# Patient Record
Sex: Female | Born: 1970
Health system: Southern US, Community
[De-identification: ages and names within clinical notes are randomized; demographics above are authoritative.]

## PROBLEM LIST (undated history)

## (undated) DIAGNOSIS — F32A Depression, unspecified: Secondary | ICD-10-CM

## (undated) DIAGNOSIS — F329 Major depressive disorder, single episode, unspecified: Secondary | ICD-10-CM

## (undated) DIAGNOSIS — K219 Gastro-esophageal reflux disease without esophagitis: Secondary | ICD-10-CM

## (undated) DIAGNOSIS — I10 Essential (primary) hypertension: Secondary | ICD-10-CM

## (undated) DIAGNOSIS — E669 Obesity, unspecified: Secondary | ICD-10-CM

## (undated) DIAGNOSIS — E7800 Pure hypercholesterolemia, unspecified: Secondary | ICD-10-CM

## (undated) HISTORY — PX: FASCIOTOMY FOOT / TOE: SUR603

## (undated) HISTORY — PX: NOVASURE ABLATION: SHX5394

## (undated) HISTORY — DX: Gastro-esophageal reflux disease without esophagitis: K21.9

## (undated) HISTORY — DX: Obesity, unspecified: E66.9

## (undated) HISTORY — PX: BREAST REDUCTION SURGERY: SHX8

---

## 1997-07-26 ENCOUNTER — Encounter: Admission: RE | Admit: 1997-07-26 | Discharge: 1997-10-24 | Payer: Self-pay | Admitting: Family Medicine

## 1997-11-24 ENCOUNTER — Encounter: Admission: RE | Admit: 1997-11-24 | Discharge: 1998-02-22 | Payer: Self-pay | Admitting: Family Medicine

## 2001-12-25 ENCOUNTER — Encounter: Admission: RE | Admit: 2001-12-25 | Discharge: 2002-03-25 | Payer: Self-pay | Admitting: *Deleted

## 2001-12-26 ENCOUNTER — Encounter: Admission: RE | Admit: 2001-12-26 | Discharge: 2001-12-26 | Payer: Self-pay | Admitting: *Deleted

## 2002-09-24 ENCOUNTER — Other Ambulatory Visit: Admission: RE | Admit: 2002-09-24 | Discharge: 2002-09-24 | Payer: Self-pay | Admitting: Obstetrics and Gynecology

## 2004-07-04 ENCOUNTER — Other Ambulatory Visit: Admission: RE | Admit: 2004-07-04 | Discharge: 2004-07-04 | Payer: Self-pay | Admitting: Family Medicine

## 2005-04-19 ENCOUNTER — Encounter: Admission: RE | Admit: 2005-04-19 | Discharge: 2005-04-19 | Payer: Self-pay | Admitting: Family Medicine

## 2009-05-22 ENCOUNTER — Emergency Department (HOSPITAL_COMMUNITY): Admission: EM | Admit: 2009-05-22 | Discharge: 2009-05-22 | Payer: Self-pay | Admitting: Internal Medicine

## 2010-06-05 ENCOUNTER — Emergency Department (HOSPITAL_COMMUNITY)
Admission: EM | Admit: 2010-06-05 | Discharge: 2010-06-05 | Disposition: A | Discharge status: Home / Self Care | Payer: Self-pay | Attending: Emergency Medicine | Admitting: Emergency Medicine

## 2010-06-05 DIAGNOSIS — R42 Dizziness and giddiness: Secondary | ICD-10-CM | POA: Insufficient documentation

## 2010-06-05 DIAGNOSIS — N898 Other specified noninflammatory disorders of vagina: Secondary | ICD-10-CM | POA: Insufficient documentation

## 2010-06-05 DIAGNOSIS — Z79899 Other long term (current) drug therapy: Secondary | ICD-10-CM | POA: Insufficient documentation

## 2010-06-05 DIAGNOSIS — I10 Essential (primary) hypertension: Secondary | ICD-10-CM | POA: Insufficient documentation

## 2010-06-05 DIAGNOSIS — N920 Excessive and frequent menstruation with regular cycle: Secondary | ICD-10-CM | POA: Insufficient documentation

## 2010-06-05 DIAGNOSIS — D649 Anemia, unspecified: Secondary | ICD-10-CM | POA: Insufficient documentation

## 2010-06-05 DIAGNOSIS — E785 Hyperlipidemia, unspecified: Secondary | ICD-10-CM | POA: Insufficient documentation

## 2010-06-05 DIAGNOSIS — R112 Nausea with vomiting, unspecified: Secondary | ICD-10-CM | POA: Insufficient documentation

## 2010-06-05 LAB — DIFFERENTIAL
Basophils Absolute: 0 10*3/uL (ref 0.0–0.1)
Basophils Relative: 1 % (ref 0–1)
Eosinophils Absolute: 0.1 10*3/uL (ref 0.0–0.7)
Eosinophils Relative: 3 % (ref 0–5)
Lymphocytes Relative: 42 % (ref 12–46)
Lymphs Abs: 1.9 10*3/uL (ref 0.7–4.0)
Monocytes Absolute: 0.3 10*3/uL (ref 0.1–1.0)
Monocytes Relative: 6 % (ref 3–12)
Neutro Abs: 2.2 10*3/uL (ref 1.7–7.7)
Neutrophils Relative %: 48 % (ref 43–77)

## 2010-06-05 LAB — POCT I-STAT, CHEM 8
BUN: 13 mg/dL (ref 6–23)
Calcium, Ion: 1.19 mmol/L (ref 1.12–1.32)
Chloride: 104 mEq/L (ref 96–112)
Creatinine, Ser: 1.1 mg/dL (ref 0.4–1.2)
Glucose, Bld: 108 mg/dL — ABNORMAL HIGH (ref 70–99)
HCT: 33 % — ABNORMAL LOW (ref 36.0–46.0)
Hemoglobin: 11.2 g/dL — ABNORMAL LOW (ref 12.0–15.0)
Potassium: 3.6 mEq/L (ref 3.5–5.1)
Sodium: 139 mEq/L (ref 135–145)
TCO2: 25 mmol/L (ref 0–100)

## 2010-06-05 LAB — CBC
MCH: 25.7 pg — ABNORMAL LOW (ref 26.0–34.0)
MCV: 79.8 fL (ref 78.0–100.0)
Platelets: 344 10*3/uL (ref 150–400)
RBC: 4.01 MIL/uL (ref 3.87–5.11)

## 2010-06-05 LAB — URINALYSIS, ROUTINE W REFLEX MICROSCOPIC
Bilirubin Urine: NEGATIVE
Hgb urine dipstick: NEGATIVE
Specific Gravity, Urine: 1.022 (ref 1.005–1.030)
Urobilinogen, UA: 1 mg/dL (ref 0.0–1.0)

## 2010-07-03 ENCOUNTER — Other Ambulatory Visit (HOSPITAL_COMMUNITY): Payer: Self-pay | Admitting: Obstetrics

## 2010-07-03 DIAGNOSIS — Z1231 Encounter for screening mammogram for malignant neoplasm of breast: Secondary | ICD-10-CM

## 2010-07-05 ENCOUNTER — Other Ambulatory Visit (HOSPITAL_COMMUNITY): Payer: Self-pay | Admitting: Obstetrics

## 2010-07-05 ENCOUNTER — Ambulatory Visit (HOSPITAL_COMMUNITY)
Admission: RE | Admit: 2010-07-05 | Discharge: 2010-07-05 | Disposition: A | Discharge status: Home / Self Care | Payer: PRIVATE HEALTH INSURANCE | Source: Ambulatory Visit | Attending: Obstetrics | Admitting: Obstetrics

## 2010-07-05 DIAGNOSIS — Z1231 Encounter for screening mammogram for malignant neoplasm of breast: Secondary | ICD-10-CM | POA: Insufficient documentation

## 2010-07-05 DIAGNOSIS — R1900 Intra-abdominal and pelvic swelling, mass and lump, unspecified site: Secondary | ICD-10-CM

## 2010-07-07 ENCOUNTER — Ambulatory Visit (HOSPITAL_COMMUNITY)
Admission: RE | Admit: 2010-07-07 | Discharge: 2010-07-07 | Disposition: A | Discharge status: Home / Self Care | Payer: PRIVATE HEALTH INSURANCE | Source: Ambulatory Visit | Attending: Obstetrics | Admitting: Obstetrics

## 2010-07-07 ENCOUNTER — Ambulatory Visit (HOSPITAL_COMMUNITY): Payer: PRIVATE HEALTH INSURANCE

## 2010-07-07 DIAGNOSIS — N949 Unspecified condition associated with female genital organs and menstrual cycle: Secondary | ICD-10-CM | POA: Insufficient documentation

## 2010-07-07 DIAGNOSIS — N938 Other specified abnormal uterine and vaginal bleeding: Secondary | ICD-10-CM | POA: Insufficient documentation

## 2010-07-07 DIAGNOSIS — D251 Intramural leiomyoma of uterus: Secondary | ICD-10-CM | POA: Insufficient documentation

## 2010-07-07 DIAGNOSIS — R1900 Intra-abdominal and pelvic swelling, mass and lump, unspecified site: Secondary | ICD-10-CM

## 2010-07-17 ENCOUNTER — Encounter: Payer: Self-pay | Admitting: Obstetrics & Gynecology

## 2010-07-26 ENCOUNTER — Other Ambulatory Visit: Payer: Self-pay | Admitting: Obstetrics

## 2010-07-26 ENCOUNTER — Encounter (HOSPITAL_COMMUNITY): Payer: PRIVATE HEALTH INSURANCE

## 2010-07-26 LAB — CBC
HCT: 38.8 % (ref 36.0–46.0)
MCV: 82 fL (ref 78.0–100.0)
RBC: 4.73 MIL/uL (ref 3.87–5.11)
RDW: 16.4 % — ABNORMAL HIGH (ref 11.5–15.5)
WBC: 4.7 10*3/uL (ref 4.0–10.5)

## 2010-07-26 LAB — BASIC METABOLIC PANEL
BUN: 16 mg/dL (ref 6–23)
CO2: 26 mEq/L (ref 19–32)
Chloride: 99 mEq/L (ref 96–112)
Glucose, Bld: 111 mg/dL — ABNORMAL HIGH (ref 70–99)
Potassium: 3.5 mEq/L (ref 3.5–5.1)

## 2010-08-02 ENCOUNTER — Other Ambulatory Visit: Payer: Self-pay | Admitting: Obstetrics

## 2010-08-02 ENCOUNTER — Ambulatory Visit (HOSPITAL_COMMUNITY)
Admission: RE | Admit: 2010-08-02 | Discharge: 2010-08-02 | Disposition: A | Discharge status: Home / Self Care | Payer: PRIVATE HEALTH INSURANCE | Source: Ambulatory Visit | Attending: Obstetrics | Admitting: Obstetrics

## 2010-08-02 DIAGNOSIS — N949 Unspecified condition associated with female genital organs and menstrual cycle: Secondary | ICD-10-CM | POA: Insufficient documentation

## 2010-08-02 DIAGNOSIS — Z01818 Encounter for other preprocedural examination: Secondary | ICD-10-CM | POA: Insufficient documentation

## 2010-08-02 DIAGNOSIS — N938 Other specified abnormal uterine and vaginal bleeding: Secondary | ICD-10-CM | POA: Insufficient documentation

## 2010-08-02 DIAGNOSIS — D259 Leiomyoma of uterus, unspecified: Secondary | ICD-10-CM | POA: Insufficient documentation

## 2010-08-02 DIAGNOSIS — Z01812 Encounter for preprocedural laboratory examination: Secondary | ICD-10-CM | POA: Insufficient documentation

## 2010-08-09 NOTE — Op Note (Signed)
  NAME:  Patricia Mcclure, Patricia Mcclure NO.:  [id number]  MEDICAL RECORD NO.:  [id number]           PATIENT TYPE:  O  LOCATION:  [facility]                          [facility]  [facility]  PHYSICIAN:  Kathreen Cosier, M.D.DATE OF BIRTH:  [birth year]  DATE OF PROCEDURE:  08/02/2010 DATE OF DISCHARGE:                              OPERATIVE REPORT   PREOPERATIVE DIAGNOSIS:  Dysfunctional uterine bleeding.  POSTOPERATIVE DIAGNOSIS:  Dysfunctional uterine bleeding and myoma uteri.  SURGEON:  Kathreen Cosier, MD  DESCRIPTION OF PROCEDURE:  Under general anesthesia, perineum and vagina prepped and draped.  Bladder emptied with straight catheter.  Bimanual exam revealed the uterus to be enlarged, 12-14 weeks' size, irregular. Speculum was placed in the vagina.  Cervix injected with 10 mL of 1% Xylocaine.  Anterior lip of the cervix grasped with tenaculum. Endometrial cavity sounded to 11 cm.  The cervix measured at 6 cm giving a cavity length of 5 cm.  Cervix dilated to #25 Shawnie Pons.  The hysteroscope was inserted.  The cavity was cleaned.  The hysteroscope was removed and sharp curettage performed.  Small amount of tissue obtained.  The NovaSure device was then inserted in the cavity, integrity test passed, and then ablation was done for 1 minute at 110 watts.  The cavity width was 4 cm.  Post ablation, a repeat hysteroscopy revealed total ablation of the cavity.  The fluid deficit was 85 mL.  The patient tolerated the procedure well and taken to the recovery room in good condition.          ______________________________ Kathreen Cosier, M.D.     BAM/MEDQ  D:  08/02/2010  T:  08/02/2010  Job:  [id number]  Electronically Signed by Francoise Ceo M.D. on 08/09/2010 10:13:21 AM

## 2011-07-27 ENCOUNTER — Emergency Department (INDEPENDENT_AMBULATORY_CARE_PROVIDER_SITE_OTHER)
Admission: EM | Admit: 2011-07-27 | Discharge: 2011-07-27 | Disposition: A | Discharge status: Home / Self Care | Payer: Self-pay | Source: Home / Self Care | Attending: Family Medicine | Admitting: Family Medicine

## 2011-07-27 ENCOUNTER — Encounter (HOSPITAL_COMMUNITY): Payer: Self-pay

## 2011-07-27 DIAGNOSIS — R229 Localized swelling, mass and lump, unspecified: Secondary | ICD-10-CM

## 2011-07-27 HISTORY — DX: Pure hypercholesterolemia, unspecified: E78.00

## 2011-07-27 HISTORY — DX: Essential (primary) hypertension: I10

## 2011-07-27 NOTE — ED Provider Notes (Signed)
History     CSN: [id number]  Arrival date & time 07/27/11  9147   First MD Initiated Contact with Patient 07/27/11 [phone]      Chief Complaint  Patient presents with  . Breast Mass    (Consider location/radiation/quality/duration/timing/severity/associated sxs/prior treatment) Patient is a 41 y.o. female presenting with rash. The history is provided by the patient.  Rash  This is a new problem. The current episode started yesterday (pt noticed lump in skin of left breast yest.). The problem has not changed since onset.The problem is associated with nothing (had mammogram last yr.). There has been no fever.    Past Medical History  Diagnosis Date  . Hypertension   . High cholesterol     History reviewed. No pertinent past surgical history.  History reviewed. No pertinent family history.  History  Substance Use Topics  . Smoking status: Never Smoker   . Smokeless tobacco: Not on file  . Alcohol Use: No    OB History    Grav Para Term Preterm Abortions TAB SAB Ect Mult Living                  Review of Systems  Constitutional: Negative.   Skin: Positive for rash.    Allergies  Review of patient's allergies indicates no known allergies.  Home Medications   Current Outpatient Rx  Name Route Sig Dispense Refill  . LISINOPRIL 10 MG PO TABS Oral Take 10 mg by mouth daily.    Marland Kitchen LOVASTATIN 10 MG PO TABS Oral Take 10 mg by mouth at bedtime.      BP 151/74  Pulse 64  Temp(Src) 98.4 F (36.9 C) (Oral)  Resp 16  SpO2 99%  Physical Exam  Nursing note and vitals reviewed. Constitutional: She is oriented to person, place, and time. She appears well-developed and well-nourished.  Neurological: She is alert and oriented to person, place, and time.  Skin: Skin is warm and dry. Rash noted.       2mm intradermal mobile nodule to left lat Breast, 3o clock location, nontender no drainage, no erythema, remainder of bilat breast exam --benign fibrocystic changes, no  adenopathy, no nipple drainage.    ED Course  Procedures (including critical care time)  Labs Reviewed - No data to display No results found.   1. Single skin nodule       MDM          Linna Hoff, MD 07/27/11 1044

## 2011-07-27 NOTE — Discharge Instructions (Signed)
Use warm soak and neosporin ointment as needed, see your doctor or return if other changes develop.

## 2011-07-27 NOTE — ED Notes (Signed)
States she noted a lump in her LEFT breast while in shower yesterday; concerned it may be a bite of some kind; denies pain

## 2011-08-21 ENCOUNTER — Other Ambulatory Visit (HOSPITAL_COMMUNITY): Payer: Self-pay | Admitting: Family Medicine

## 2011-08-21 DIAGNOSIS — Z1231 Encounter for screening mammogram for malignant neoplasm of breast: Secondary | ICD-10-CM

## 2011-08-30 IMAGING — US US TRANSVAGINAL NON-OB
1 series · 13 of 25 positions shown · non-contrast
Comparison: None

CLINICAL DATA: Dysfunctional uterine bleeding.  Pelvic mass.  LMP
05/03/2010.  Day 4 of Provera



[Series 1: us pelvis complete · 13 of 56 slices shown]
[im 1/56]
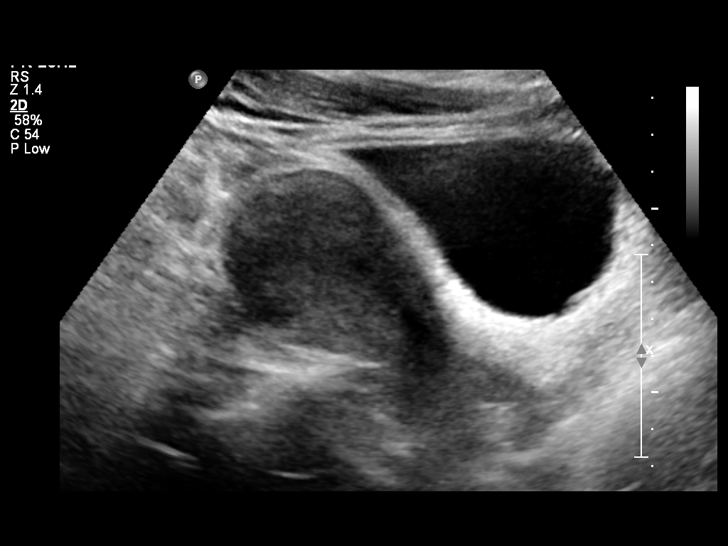
[im 5/56]
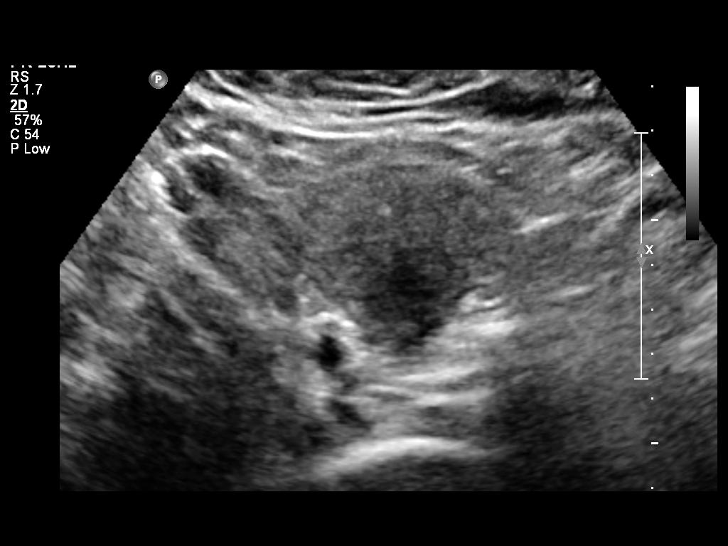
[im 10/56]
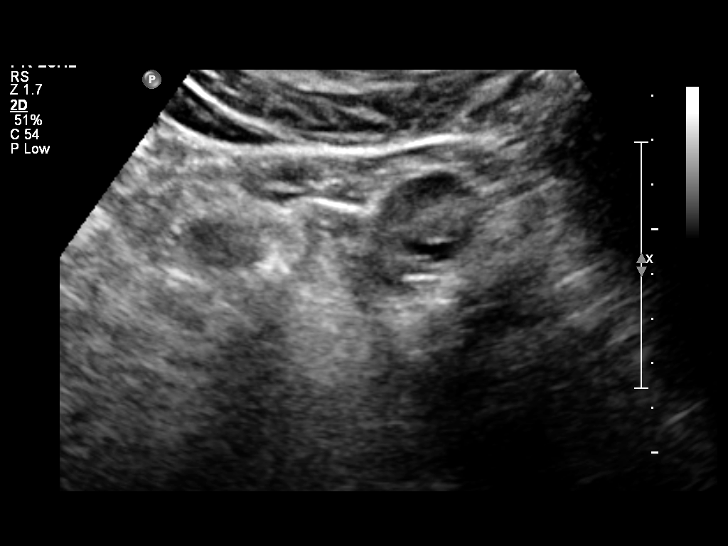
[im 14/56]
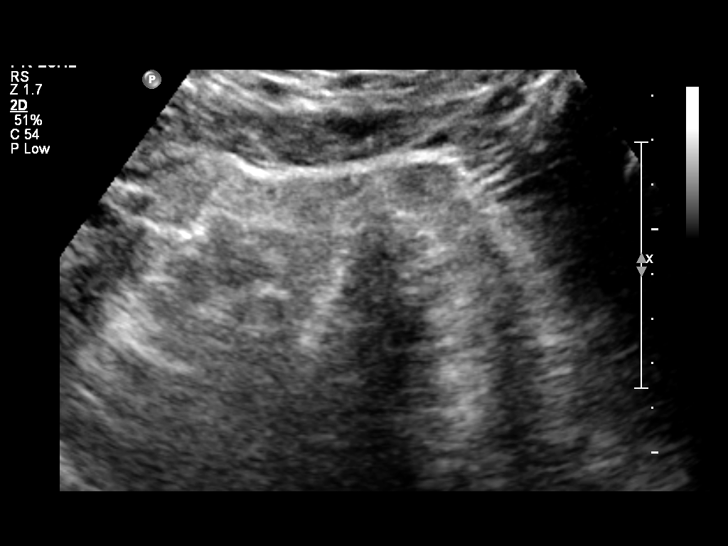
[im 19/56]
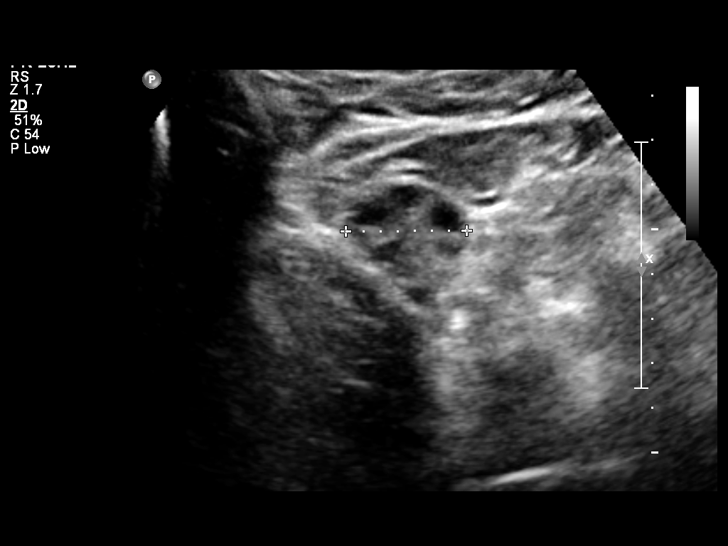
[im 23/56]
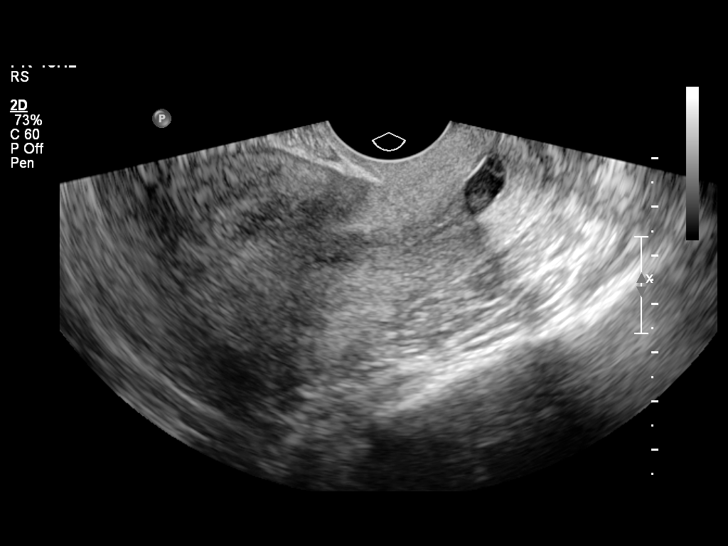
[im 28/56]
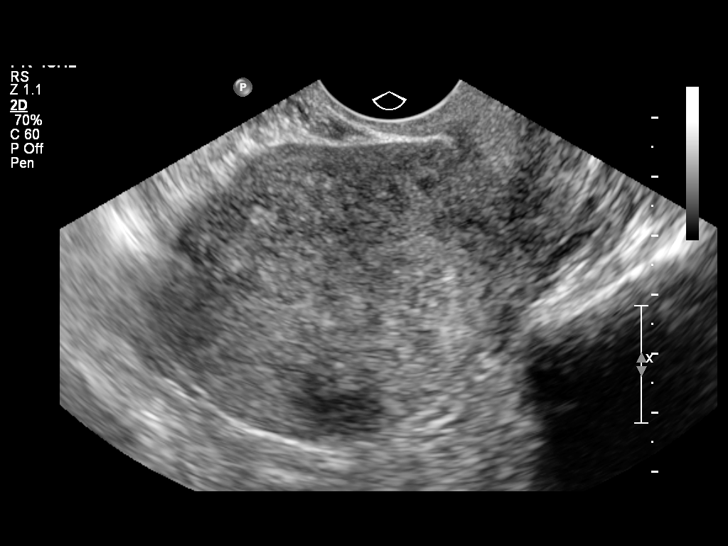
[im 33/56]
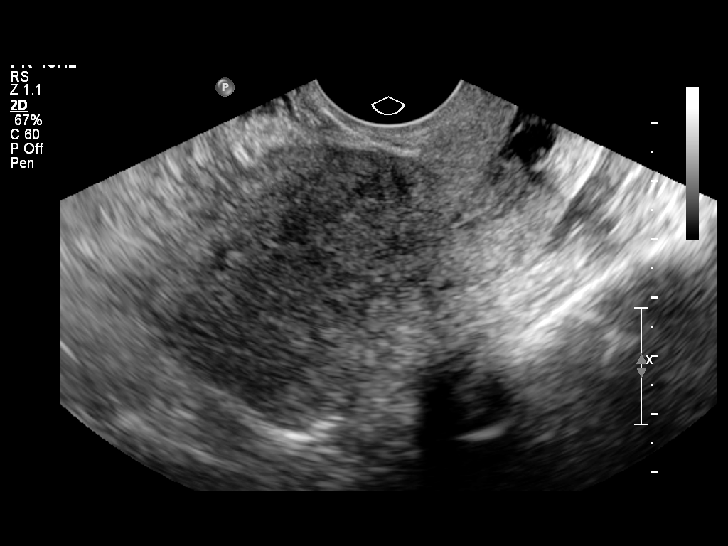
[im 37/56]
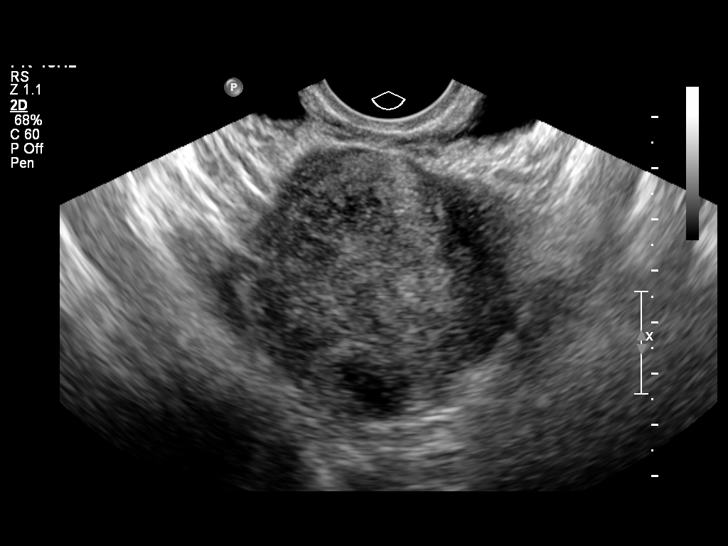
[im 42/56]
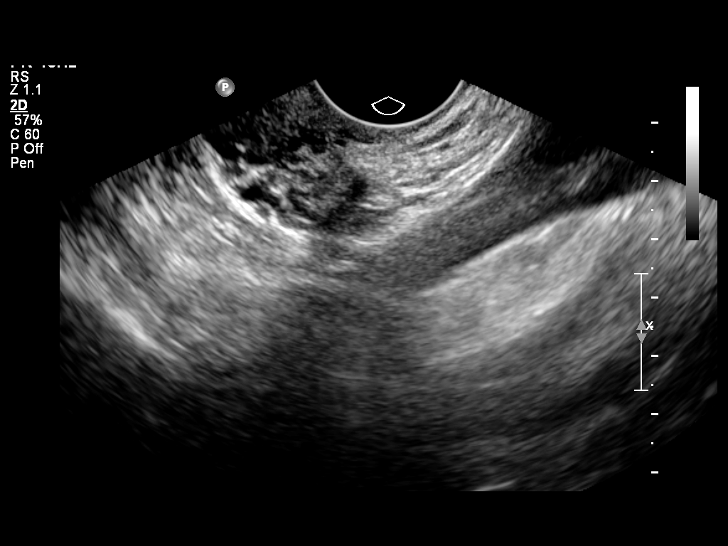
[im 46/56]
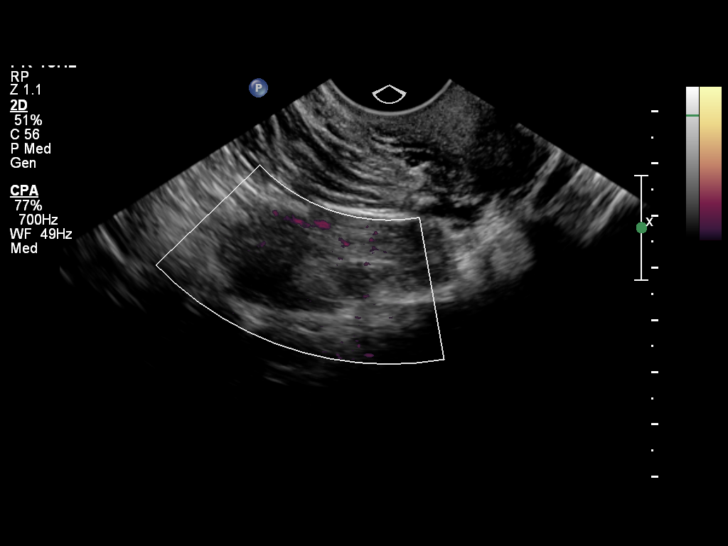
[im 51/56]
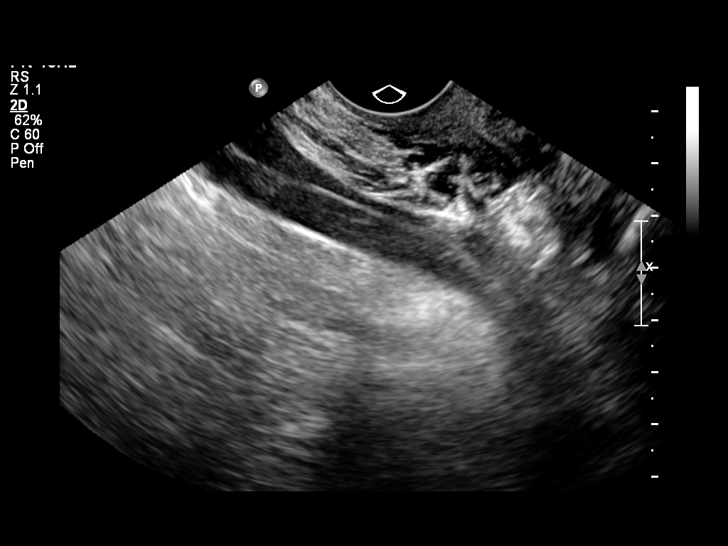
[im 56/56]
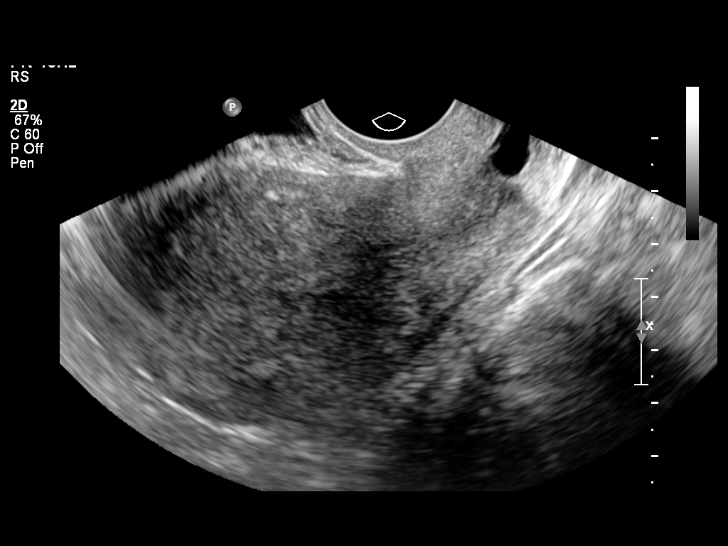

[13 of 25 positions shown; findings below may reference images not displayed]

FINDINGS: Uterus:  Demonstrates a sagittal length of 9.7 cm, an AP depth of
5.3 cm and a transverse width of 5.3 cm.  A small focal mural
fibroid is identified in the posterior upper uterine segment
measuring 1.5 x 1.3 x 1.4 cm.  The remainder of the myometrium is
diffusely heterogeneous.  No discrete myometrial shadowing or
myometrial cysts are seen but there is a poor interface between the
endometrium and myometrium and this combined with the prominent
uterine size and appearance may denote underlying adenomyosis.

Endometrium:  The poor myometrial endometrial interface precludes
accurate measurement but overall the lining appears relatively thin
with no discrete areas of thickening or masses.

Right ovary:  Has a normal appearance measuring 3.4 x 2.2 x 2.7 cm.

Left ovary:  Is best seen transabdominally and has a normal
appearance measuring 3.1 x 2.2 x 2.5 cm.

Other findings:  No pelvic fluid or separate adnexal masses are
seen.
IMPRESSION: Prominent uterine size with one small focal mural fibroid and
otherwise diffuse myometrial heterogeneity.  A poor endometrial
myometrial interface is identified and this combination of findings
suggests the possibility of underlying adenomyosis.  This can be
confirmed with pelvic MRI if further evaluation is desired.

Normal ovaries.

## 2011-09-12 ENCOUNTER — Ambulatory Visit (HOSPITAL_COMMUNITY)
Admission: RE | Admit: 2011-09-12 | Discharge: 2011-09-12 | Disposition: A | Discharge status: Home / Self Care | Payer: Self-pay | Source: Ambulatory Visit | Attending: Family Medicine | Admitting: Family Medicine

## 2011-09-12 DIAGNOSIS — Z1231 Encounter for screening mammogram for malignant neoplasm of breast: Secondary | ICD-10-CM

## 2012-04-17 ENCOUNTER — Ambulatory Visit (INDEPENDENT_AMBULATORY_CARE_PROVIDER_SITE_OTHER): Payer: BC Managed Care – PPO | Admitting: General Surgery

## 2012-05-05 HISTORY — PX: FASCIOTOMY FOOT / TOE: SUR603

## 2012-06-23 ENCOUNTER — Other Ambulatory Visit: Payer: Self-pay | Admitting: Physician Assistant

## 2012-06-23 ENCOUNTER — Other Ambulatory Visit (HOSPITAL_COMMUNITY)
Admission: RE | Admit: 2012-06-23 | Discharge: 2012-06-23 | Disposition: A | Discharge status: Home / Self Care | Payer: Self-pay | Source: Ambulatory Visit | Attending: Family Medicine | Admitting: Family Medicine

## 2012-06-23 DIAGNOSIS — Z0000 Encounter for general adult medical examination without abnormal findings: Secondary | ICD-10-CM | POA: Insufficient documentation

## 2012-08-18 ENCOUNTER — Other Ambulatory Visit (HOSPITAL_COMMUNITY): Payer: Self-pay | Admitting: Family Medicine

## 2012-08-18 DIAGNOSIS — Z1231 Encounter for screening mammogram for malignant neoplasm of breast: Secondary | ICD-10-CM

## 2012-09-05 ENCOUNTER — Ambulatory Visit (INDEPENDENT_AMBULATORY_CARE_PROVIDER_SITE_OTHER): Payer: BC Managed Care – PPO | Admitting: General Surgery

## 2012-09-05 ENCOUNTER — Encounter (INDEPENDENT_AMBULATORY_CARE_PROVIDER_SITE_OTHER): Payer: Self-pay | Admitting: General Surgery

## 2012-09-05 DIAGNOSIS — K219 Gastro-esophageal reflux disease without esophagitis: Secondary | ICD-10-CM

## 2012-09-05 LAB — COMPREHENSIVE METABOLIC PANEL
Albumin: 4.1 g/dL (ref 3.5–5.2)
Alkaline Phosphatase: 49 U/L (ref 39–117)
BUN: 13 mg/dL (ref 6–23)
CO2: 26 mEq/L (ref 19–32)
Glucose, Bld: 81 mg/dL (ref 70–99)
Potassium: 4.1 mEq/L (ref 3.5–5.3)
Total Bilirubin: 0.5 mg/dL (ref 0.3–1.2)

## 2012-09-05 LAB — CBC WITH DIFFERENTIAL/PLATELET
Basophils Relative: 0 % (ref 0–1)
Eosinophils Absolute: 0 10*3/uL (ref 0.0–0.7)
HCT: 39.4 % (ref 36.0–46.0)
Hemoglobin: 13 g/dL (ref 12.0–15.0)
MCH: 27.4 pg (ref 26.0–34.0)
MCHC: 33 g/dL (ref 30.0–36.0)
Monocytes Absolute: 0.4 10*3/uL (ref 0.1–1.0)
Monocytes Relative: 7 % (ref 3–12)

## 2012-09-05 LAB — T4: T4, Total: 8 ug/dL (ref 5.0–12.5)

## 2012-09-05 LAB — TSH: TSH: 1.269 u[IU]/mL (ref 0.350–4.500)

## 2012-09-05 NOTE — Progress Notes (Signed)
Subjective:   obesity, foot and joint pain  Patient ID: Patricia Mcclure, female   DOB: May 30, 1970, 42 y.o.   MRN: [id number]  HPI Patient is a very pleasant 42 year old female here for consideration for surgical treatment for morbid obesity. She gives a history of significant struggles with her weight since late teenage years. She has been through innumerable efforts at diet and exercise programs including Atkins diet and Weight Watchers. She has had some significant success losing up to 50 pounds at a time but then experiences progressive weight regain. She actually was in our bariatric program about 10 years ago but due to nonsurgical weight loss that she was able to accomplish at that time she was not felt to be a candidate. However she has found it increasingly difficult to get the weight off despite her best efforts. She on talking to her watches her food intake quite closely and exercises as much as she can regularly despite pain from plantar fasciitis. Despite this she is not able to lose a significant amount of weight. She has significant problems with foot and ankle pain which her podiatrist this is directly related to her weight. She has also developed a borderline hypertension and dyslipidemia and is concerned about her long-term health going forward and her weight. She has been again recently to our information seminar. She likes aspects of the lap band and the gastric sleeve from the information she has. The patient does have history of chronic reflux although no longer takes proton pump inhibitors and is minimally symptomatic unless she eats any acid foods or sodas which she avoids.  Past Medical History  Diagnosis Date  . Hypertension   . High cholesterol   . GERD (gastroesophageal reflux disease)    Past Surgical History  Procedure Laterality Date  . Fasciotomy foot / toe  [id number]   No current outpatient prescriptions on file.   No current facility-administered medications for this  visit.   No Known Allergies History  Substance Use Topics  . Smoking status: Never Smoker   . Smokeless tobacco: Never Used  . Alcohol Use: No     Review of Systems  Constitutional: Negative.   HENT: Negative.   Respiratory: Negative.   Cardiovascular: Negative.   Gastrointestinal: Negative for nausea, abdominal pain, diarrhea, constipation and blood in stool.       Occasional GERD with certain foods  Genitourinary:       Some stress incontinence  Musculoskeletal: Positive for arthralgias and gait problem.  Psychiatric/Behavioral: Negative.        Objective:   Physical Exam BP 128/80  Pulse 72  Temp(Src) 98.8 F (37.1 C) (Temporal)  Resp 16  Ht 5\' 2"  (1.575 m)  Wt 250 lb 12.8 oz (113.762 kg)  BMI 45.86 kg/m2 General: Alert, morbidly obese African American female, in no distress Skin: Warm and dry without rash or infection. HEENT: No palpable masses or thyromegaly. Sclera nonicteric. Pupils equal round and reactive. Oropharynx clear. Lymph nodes: No cervical, supraclavicular, or inguinal nodes palpable. Lungs: Breath sounds clear and equal without increased work of breathing Cardiovascular: Regular rate and rhythm without murmur. No JVD or edema. Peripheral pulses intact. Abdomen: Nondistended. Soft and nontender. No masses palpable. No organomegaly. No palpable hernias. Extremities: No edema or joint swelling or deformity. No chronic venous stasis changes. Neurologic: Alert and fully oriented. Gait normal.    Assessment:     Progressive morbid obesity that has been unresponsive to her best efforts at diet and exercise over a  number of years. She has significant foot and joint pain directly related to her weight as well as borderline hypertension and elevated cholesterol and stress urinary incontinence and some degree of GERD as well. We discussed in detail surgical options available. After discussion she feels she would prefer the lap band due to the upfront safety and  quick recovery as well as the risk of making her reflux worse with the gastric sleeve. I believe this would be a good choice for her. We discussed the procedure in detail including its nature and recovery. We discussed risks of anesthetic complications, or injury, dysphagia, bleeding and infection were discussed long-term risks of esophageal dilatation, weight loss failure, slippage, erosion and mechanical failure. She was given a complete consent form to review. All her questions were answered. The patient is motivated and educated and enjoys regular exercise think is a very good chance of success. We'll go preoperative workup including psychologic and nutrition evaluation, upper GI series and routine lab work. I'll see her back following these studies.    Plan:     Begin preoperative workup for lap band placement as above.

## 2012-09-09 ENCOUNTER — Encounter: Payer: BC Managed Care – PPO | Attending: General Surgery | Admitting: *Deleted

## 2012-09-09 ENCOUNTER — Encounter: Payer: Self-pay | Admitting: *Deleted

## 2012-09-09 DIAGNOSIS — Z01818 Encounter for other preprocedural examination: Secondary | ICD-10-CM | POA: Insufficient documentation

## 2012-09-09 DIAGNOSIS — Z713 Dietary counseling and surveillance: Secondary | ICD-10-CM | POA: Insufficient documentation

## 2012-09-09 NOTE — Progress Notes (Signed)
Patient was seen on 09/09/2012 for Pre-Operative Gastric Bypass Nutrition Assessment. Assessment and letter of approval faxed to [facility] Bariatric Surgery Program coordinator on 09/10/12.   Handouts given during visit include:  Pre-Op Goals Bariatric Surgery Protein Shakes  Patient to call the [facility] to enroll in Pre-Op and Post-Op Nutrition Education when surgery date is scheduled.

## 2012-09-10 NOTE — Patient Instructions (Addendum)
   Follow Pre-Op Nutrition Goals to prepare for Lapband Surgery.   Call the [facility] at [phone] once you have been given your surgery date to enrolled in the Pre-Op Nutrition Class. You will need to attend this nutrition class 3-4 weeks prior to your surgery. 

## 2012-09-15 ENCOUNTER — Ambulatory Visit (HOSPITAL_COMMUNITY)
Admission: RE | Admit: 2012-09-15 | Discharge: 2012-09-15 | Disposition: A | Discharge status: Home / Self Care | Payer: BC Managed Care – PPO | Source: Ambulatory Visit | Attending: Family Medicine | Admitting: Family Medicine

## 2012-09-15 DIAGNOSIS — Z1231 Encounter for screening mammogram for malignant neoplasm of breast: Secondary | ICD-10-CM | POA: Insufficient documentation

## 2012-09-17 ENCOUNTER — Ambulatory Visit (HOSPITAL_COMMUNITY)
Admission: RE | Admit: 2012-09-17 | Discharge: 2012-09-17 | Disposition: A | Discharge status: Home / Self Care | Payer: BC Managed Care – PPO | Source: Ambulatory Visit | Attending: General Surgery | Admitting: General Surgery

## 2012-09-17 ENCOUNTER — Other Ambulatory Visit: Payer: Self-pay

## 2012-09-17 DIAGNOSIS — K219 Gastro-esophageal reflux disease without esophagitis: Secondary | ICD-10-CM | POA: Insufficient documentation

## 2012-09-17 DIAGNOSIS — E7800 Pure hypercholesterolemia, unspecified: Secondary | ICD-10-CM | POA: Insufficient documentation

## 2012-09-17 DIAGNOSIS — I10 Essential (primary) hypertension: Secondary | ICD-10-CM | POA: Insufficient documentation

## 2012-09-17 DIAGNOSIS — Z6841 Body Mass Index (BMI) 40.0 and over, adult: Secondary | ICD-10-CM | POA: Insufficient documentation

## 2012-09-29 ENCOUNTER — Ambulatory Visit (HOSPITAL_COMMUNITY): Payer: BC Managed Care – PPO

## 2012-09-29 ENCOUNTER — Other Ambulatory Visit (HOSPITAL_COMMUNITY): Payer: Self-pay

## 2012-10-07 ENCOUNTER — Other Ambulatory Visit (INDEPENDENT_AMBULATORY_CARE_PROVIDER_SITE_OTHER): Payer: Self-pay | Admitting: General Surgery

## 2012-10-07 ENCOUNTER — Telehealth (INDEPENDENT_AMBULATORY_CARE_PROVIDER_SITE_OTHER): Payer: Self-pay | Admitting: General Surgery

## 2012-10-07 NOTE — Telephone Encounter (Signed)
Please write and place orders for surgery in EPIC pt approved

## 2012-10-09 ENCOUNTER — Encounter: Payer: BC Managed Care – PPO | Attending: General Surgery

## 2012-10-09 ENCOUNTER — Other Ambulatory Visit (INDEPENDENT_AMBULATORY_CARE_PROVIDER_SITE_OTHER): Payer: Self-pay | Admitting: General Surgery

## 2012-10-09 DIAGNOSIS — Z01818 Encounter for other preprocedural examination: Secondary | ICD-10-CM | POA: Insufficient documentation

## 2012-10-09 DIAGNOSIS — Z713 Dietary counseling and surveillance: Secondary | ICD-10-CM | POA: Insufficient documentation

## 2012-10-21 ENCOUNTER — Encounter (HOSPITAL_COMMUNITY): Payer: Self-pay | Admitting: Pharmacy Technician

## 2012-10-23 ENCOUNTER — Ambulatory Visit (INDEPENDENT_AMBULATORY_CARE_PROVIDER_SITE_OTHER): Payer: BC Managed Care – PPO | Admitting: General Surgery

## 2012-10-23 ENCOUNTER — Encounter (INDEPENDENT_AMBULATORY_CARE_PROVIDER_SITE_OTHER): Payer: Self-pay | Admitting: General Surgery

## 2012-10-23 NOTE — Progress Notes (Signed)
Chief complaint: Preop lap band  History: Patient returns for a preop visit prior to lap band surgery. She has progressive morbid obesity unresponsive to medical management and comorbidities of chronic joint pain, GERD, borderline hypertension, elevated cholesterol stress urinary incontinence. She has completed her preoperative workup. No concerns on-site for nutritional evaluation. Lab work was unremarkable. Upper GI series showed reflux but no hiatal hernia or other anatomic abnormalities.  Past Medical History  Diagnosis Date  . Hypertension   . High cholesterol   . GERD (gastroesophageal reflux disease)   . Obesity    Past Surgical History  Procedure Laterality Date  . Fasciotomy foot / toe  [id number]  . Novasure ablation     Current Outpatient Prescriptions  Medication Sig Dispense Refill  . Calcium Citrate-Vitamin D (CALCIUM CITRATE + PO) Take 1,200 mg by mouth 3 (three) times daily.      . lisinopril (PRINIVIL,ZESTRIL) 10 MG tablet Take 10 mg by mouth daily.      . Multiple Vitamin (MULTIVITAMIN) tablet Take 1 tablet by mouth daily.       No current facility-administered medications for this visit.   No Known Allergies  Exam: BP 138/82  Pulse 68  Temp(Src) 98.6 F (37 C) (Temporal)  Resp 14  Ht 5' 2" (1.575 m)  Wt 251 lb (113.853 kg)  BMI 45.9 kg/m2 General: Obese otherwise well-appearing African American female Skin: No rash or infection HEENT: No palpable masses. Oropharynx clear. Are not icteric. Lungs: Clear equal breath sounds increased work of breathing Cardiac: Regular rate and rhythm no murmurs Abdomen: Soft and nontender. No hernias or palpable masses or organomegaly Extremities: No edema  Assessment and plan: Ready for lap band surgery. We discussed the surgery again and reviewed the consent form and all her questions were answered. She is starting her preoperative diet. Outpatient  planned. 

## 2012-10-23 NOTE — Patient Instructions (Addendum)
Be sure to stick to your preoperative diet.

## 2012-10-24 MED ORDER — DEXTROSE 5 % IV SOLN: 2.0000 g | INTRAVENOUS | Status: DC

## 2012-10-24 MED ORDER — HEPARIN SODIUM (PORCINE) 5000 UNIT/ML IJ SOLN: 5000.0000 [IU] | INTRAMUSCULAR | Status: DC

## 2012-10-25 NOTE — Progress Notes (Addendum)
Bariatric Class:  Appt start time: 0830 end time:  0930.  Pre-Operative Nutrition Class  Patient was seen on 10/09/12 for Pre-Operative Bariatric Surgery Education at [facility].   Surgery date: 11/04/12 Surgery type: LAGB Start weight at [facility]: 251.2 lbs (09/09/12) Goal weight: 160 lbs  Weight today: 250.5 lbs Weight change: 1.2 lb LOSS Total weight lost: 1.2 lbs  TANITA  BODY COMP RESULTS  10/09/12   BMI (kg/m^2) 45.8   Fat Mass (lbs) 125.5   Fat Free Mass (lbs) 125.0   Total Body Water (lbs) 91.5   Samples given per MNT protocol; Patient educated on appropriate usage: Bariatric Advantage Multivitamin Lot # [id number] Exp: 06/15  Bariatric Advantage Calcium Citrate Lot # [id number] Exp: 03/15  Bariatric Advantage B12 Lot # [id number] Exp: 10/15  Celebrate Vitamins Multivitamin Lot # [id number] Exp: 07/15  Celebrate Vitamins Calcium Citrate Lot # [id number] Exp: 09/15  Corliss Marcus Protein Powder Lot # [id number] Exp: 09/15  The following the learning objectives met by the patient during this course:  Identify Pre-Op Dietary Goals and will begin 2 weeks pre-operatively  Identify appropriate sources of fluids and proteins   State protein recommendations and appropriate sources pre and post-operatively  Identify Post-Operative Dietary Goals and will follow for 2 weeks post-operatively  Identify appropriate multivitamin and calcium sources  Describe the need for physical activity post-operatively and will follow MD recommendations  State when to call healthcare provider regarding medication questions or post-operative complications  Handouts given during class include:  Pre-Op Bariatric Surgery Diet Handout  Protein Shake Handout  Post-Op Bariatric Surgery Nutrition Handout  [facility] Program Information Flyer  Support Group Information Flyer  [facility] Bariatric Supplements Price List  Follow-Up Plan: Patient will follow-up at [facility] 2  weeks post operatively for diet advancement per MD.

## 2012-10-25 NOTE — Patient Instructions (Signed)
Follow:   Pre-Op Diet per MD 2 weeks prior to surgery  Phase 2- Liquids (clear/full) 2 weeks after surgery  Vitamin/Mineral/Calcium guidelines for purchasing bariatric supplements  Exercise guidelines pre and post-op per MD  Follow-up at [facility] in 2 weeks post-op for diet advancement. Contact Shavon Ashmore as needed with questions/concerns. 

## 2012-10-27 ENCOUNTER — Encounter (HOSPITAL_COMMUNITY): Payer: Self-pay

## 2012-10-27 ENCOUNTER — Encounter (HOSPITAL_COMMUNITY)
Admission: RE | Admit: 2012-10-27 | Discharge: 2012-10-27 | Disposition: A | Discharge status: Home / Self Care | Payer: BC Managed Care – PPO | Source: Ambulatory Visit | Attending: General Surgery | Admitting: General Surgery

## 2012-10-27 DIAGNOSIS — Z01812 Encounter for preprocedural laboratory examination: Secondary | ICD-10-CM | POA: Insufficient documentation

## 2012-10-27 DIAGNOSIS — Z01818 Encounter for other preprocedural examination: Secondary | ICD-10-CM | POA: Insufficient documentation

## 2012-10-27 HISTORY — DX: Major depressive disorder, single episode, unspecified: F32.9

## 2012-10-27 HISTORY — DX: Depression, unspecified: F32.A

## 2012-10-27 LAB — COMPREHENSIVE METABOLIC PANEL
ALT: 14 U/L (ref 0–35)
AST: 15 U/L (ref 0–37)
CO2: 27 mEq/L (ref 19–32)
Chloride: 99 mEq/L (ref 96–112)
Creatinine, Ser: 1 mg/dL (ref 0.50–1.10)
GFR calc Af Amer: 79 mL/min — ABNORMAL LOW (ref >90)
GFR calc non Af Amer: 68 mL/min — ABNORMAL LOW (ref >90)
Glucose, Bld: 97 mg/dL (ref 70–99)
Sodium: 133 mEq/L — ABNORMAL LOW (ref 135–145)
Total Bilirubin: 0.3 mg/dL (ref 0.3–1.2)

## 2012-10-27 LAB — CBC WITH DIFFERENTIAL/PLATELET
Basophils Absolute: 0 10*3/uL (ref 0.0–0.1)
HCT: 38.8 % (ref 36.0–46.0)
Lymphocytes Relative: 34 % (ref 12–46)
Lymphs Abs: 1.9 10*3/uL (ref 0.7–4.0)
MCV: 81.9 fL (ref 78.0–100.0)
Monocytes Absolute: 0.4 10*3/uL (ref 0.1–1.0)
Neutro Abs: 3.3 10*3/uL (ref 1.7–7.7)
RBC: 4.74 MIL/uL (ref 3.87–5.11)
RDW: 14.1 % (ref 11.5–15.5)
WBC: 5.7 10*3/uL (ref 4.0–10.5)

## 2012-10-27 NOTE — Patient Instructions (Addendum)
YERALDY SPIKE  10/27/2012                           YOUR PROCEDURE IS SCHEDULED ON: 11/04/12               PLEASE REPORT TO SHORT STAY CENTER AT :  9:00 AM               CALL THIS NUMBER IF ANY PROBLEMS THE DAY OF SURGERY :               [phone]                      REMEMBER:   Do not eat food or drink liquids AFTER MIDNIGHT    Take these medicines the morning of surgery with A SIP OF WATER:  NONE   Do not wear jewelry, make-up   Do not wear lotions, powders, or perfumes.   Do not shave legs or underarms 12 hrs. before surgery (men may shave face)  Do not bring valuables to the hospital.  Contacts, dentures or bridgework may not be worn into surgery.  Leave suitcase in the car. After surgery it may be brought to your room.  For patients admitted to the hospital more than one night, checkout time is 11:00                          The day of discharge.   Patients discharged the day of surgery will not be allowed to drive home                             If going home same day of surgery, must have someone stay with you first                           24 hrs at home and arrange for some one to drive you home from hospital.    Special Instructions:   Please read over the following fact sheets that you were given:                                 2. [place] PREPARING FOR SURGERY SHEET                                                X_____________________________________________________________________        Failure to follow these instructions may result in cancellation of your surgery

## 2012-11-04 ENCOUNTER — Encounter (HOSPITAL_COMMUNITY): Payer: Self-pay | Admitting: Certified Registered"

## 2012-11-04 ENCOUNTER — Ambulatory Visit (HOSPITAL_COMMUNITY)
Admission: RE | Admit: 2012-11-04 | Discharge: 2012-11-04 | Disposition: A | Discharge status: Home / Self Care | Payer: BC Managed Care – PPO | Source: Ambulatory Visit | Attending: General Surgery | Admitting: General Surgery

## 2012-11-04 ENCOUNTER — Encounter (HOSPITAL_COMMUNITY)
Admission: RE | Disposition: A | Discharge status: Home / Self Care | Payer: Self-pay | Source: Ambulatory Visit | Attending: General Surgery

## 2012-11-04 ENCOUNTER — Ambulatory Visit (HOSPITAL_COMMUNITY): Payer: BC Managed Care – PPO | Admitting: Certified Registered"

## 2012-11-04 ENCOUNTER — Encounter (HOSPITAL_COMMUNITY): Payer: Self-pay | Admitting: *Deleted

## 2012-11-04 ENCOUNTER — Ambulatory Visit (HOSPITAL_COMMUNITY): Payer: BC Managed Care – PPO

## 2012-11-04 DIAGNOSIS — K449 Diaphragmatic hernia without obstruction or gangrene: Secondary | ICD-10-CM | POA: Insufficient documentation

## 2012-11-04 DIAGNOSIS — I10 Essential (primary) hypertension: Secondary | ICD-10-CM | POA: Insufficient documentation

## 2012-11-04 DIAGNOSIS — E7800 Pure hypercholesterolemia, unspecified: Secondary | ICD-10-CM | POA: Insufficient documentation

## 2012-11-04 DIAGNOSIS — K2100 Gastro-esophageal reflux disease with esophagitis, without bleeding: Secondary | ICD-10-CM

## 2012-11-04 DIAGNOSIS — N393 Stress incontinence (female) (male): Secondary | ICD-10-CM | POA: Insufficient documentation

## 2012-11-04 DIAGNOSIS — Z6841 Body Mass Index (BMI) 40.0 and over, adult: Secondary | ICD-10-CM

## 2012-11-04 DIAGNOSIS — K219 Gastro-esophageal reflux disease without esophagitis: Secondary | ICD-10-CM | POA: Insufficient documentation

## 2012-11-04 HISTORY — PX: LAPAROSCOPIC GASTRIC BANDING: SHX1100

## 2012-11-04 HISTORY — PX: INSERTION OF MESH: SHX5868

## 2012-11-04 SURGERY — GASTRIC BANDING, LAPAROSCOPIC
Anesthesia: General | Site: Abdomen | Wound class: Clean

## 2012-11-04 MED ORDER — DEXTROSE 5 % IV SOLN
2.0000 g | INTRAVENOUS | Status: AC
  Administered 2012-11-04: 2 g via INTRAVENOUS
  Filled 2012-11-04: qty 2

## 2012-11-04 MED ORDER — ONDANSETRON HCL 4 MG/2ML IJ SOLN
INTRAMUSCULAR | Status: DC | PRN
  Administered 2012-11-04: 4 mg via INTRAVENOUS

## 2012-11-04 MED ORDER — ACETAMINOPHEN 160 MG/5ML PO SOLN
650.0000 mg | ORAL | Status: DC | PRN
  Filled 2012-11-04: qty 20.3

## 2012-11-04 MED ORDER — OXYCODONE-ACETAMINOPHEN 5-325 MG/5ML PO SOLN: 5.0000 mL | ORAL | Status: DC | PRN

## 2012-11-04 MED ORDER — ROCURONIUM BROMIDE 100 MG/10ML IV SOLN
INTRAVENOUS | Status: DC | PRN
  Administered 2012-11-04: 40 mg via INTRAVENOUS

## 2012-11-04 MED ORDER — HEPARIN SODIUM (PORCINE) 5000 UNIT/ML IJ SOLN
5000.0000 [IU] | INTRAMUSCULAR | Status: AC
  Administered 2012-11-04: 5000 [IU] via SUBCUTANEOUS
  Filled 2012-11-04: qty 1

## 2012-11-04 MED ORDER — NEOSTIGMINE METHYLSULFATE 1 MG/ML IJ SOLN
INTRAMUSCULAR | Status: DC | PRN
  Administered 2012-11-04: 4 mg via INTRAVENOUS

## 2012-11-04 MED ORDER — UNJURY CHOCOLATE CLASSIC POWDER
2.0000 [oz_av] | Freq: Four times a day (QID) | ORAL | Status: DC
  Filled 2012-11-04 (×4): qty 27

## 2012-11-04 MED ORDER — ACETAMINOPHEN 10 MG/ML IV SOLN
1000.0000 mg | Freq: Once | INTRAVENOUS | Status: AC
  Administered 2012-11-04: 1000 mg via INTRAVENOUS
  Filled 2012-11-04: qty 100

## 2012-11-04 MED ORDER — MIDAZOLAM HCL 5 MG/5ML IJ SOLN
INTRAMUSCULAR | Status: DC | PRN
  Administered 2012-11-04: 2 mg via INTRAVENOUS

## 2012-11-04 MED ORDER — ONDANSETRON HCL 4 MG/2ML IJ SOLN: 4.0000 mg | INTRAMUSCULAR | Status: DC | PRN

## 2012-11-04 MED ORDER — DEXAMETHASONE SODIUM PHOSPHATE 10 MG/ML IJ SOLN
INTRAMUSCULAR | Status: DC | PRN
  Administered 2012-11-04: 10 mg via INTRAVENOUS

## 2012-11-04 MED ORDER — EPHEDRINE SULFATE 50 MG/ML IJ SOLN
INTRAMUSCULAR | Status: DC | PRN
  Administered 2012-11-04: 10 mg via INTRAVENOUS

## 2012-11-04 MED ORDER — BUPIVACAINE-EPINEPHRINE PF 0.25-1:200000 % IJ SOLN
INTRAMUSCULAR | Status: DC | PRN
  Administered 2012-11-04: 40 mL

## 2012-11-04 MED ORDER — SODIUM CHLORIDE 0.9 % IJ SOLN
INTRAMUSCULAR | Status: DC | PRN
  Administered 2012-11-04: 50 mL

## 2012-11-04 MED ORDER — HYDROMORPHONE HCL PF 1 MG/ML IJ SOLN
0.2500 mg | INTRAMUSCULAR | Status: DC | PRN
  Administered 2012-11-04: 0.25 mg via INTRAVENOUS

## 2012-11-04 MED ORDER — MORPHINE SULFATE 10 MG/ML IJ SOLN: 2.0000 mg | INTRAMUSCULAR | Status: DC | PRN

## 2012-11-04 MED ORDER — PROMETHAZINE HCL 25 MG/ML IJ SOLN: 6.2500 mg | INTRAMUSCULAR | Status: DC | PRN

## 2012-11-04 MED ORDER — GLYCOPYRROLATE 0.2 MG/ML IJ SOLN
INTRAMUSCULAR | Status: DC | PRN
  Administered 2012-11-04: 0.6 mg via INTRAVENOUS

## 2012-11-04 MED ORDER — 0.9 % SODIUM CHLORIDE (POUR BTL) OPTIME
TOPICAL | Status: DC | PRN
  Administered 2012-11-04: 1000 mL

## 2012-11-04 MED ORDER — PROPOFOL 10 MG/ML IV BOLUS
INTRAVENOUS | Status: DC | PRN
  Administered 2012-11-04: 180 mg via INTRAVENOUS

## 2012-11-04 MED ORDER — OXYCODONE-ACETAMINOPHEN 5-325 MG/5ML PO SOLN
5.0000 mL | ORAL | Status: DC | PRN
  Administered 2012-11-04: 5 mL via ORAL
  Filled 2012-11-04: qty 5

## 2012-11-04 MED ORDER — POTASSIUM CHLORIDE IN NACL 20-0.9 MEQ/L-% IV SOLN: INTRAVENOUS | Status: DC

## 2012-11-04 MED ORDER — UNJURY VANILLA POWDER
2.0000 [oz_av] | Freq: Four times a day (QID) | ORAL | Status: DC
  Filled 2012-11-04 (×4): qty 27

## 2012-11-04 MED ORDER — OXYCODONE HCL 5 MG/5ML PO SOLN: 5.0000 mg | ORAL | Status: DC | PRN

## 2012-11-04 MED ORDER — LACTATED RINGERS IV SOLN
INTRAVENOUS | Status: DC
  Administered 2012-11-04: 1000 mL via INTRAVENOUS

## 2012-11-04 MED ORDER — LACTATED RINGERS IR SOLN
Status: DC | PRN
  Administered 2012-11-04: 1000 mL

## 2012-11-04 MED ORDER — FENTANYL CITRATE 0.05 MG/ML IJ SOLN
INTRAMUSCULAR | Status: DC | PRN
  Administered 2012-11-04 (×5): 50 ug via INTRAVENOUS

## 2012-11-04 MED ORDER — SUCCINYLCHOLINE CHLORIDE 20 MG/ML IJ SOLN
INTRAMUSCULAR | Status: DC | PRN
  Administered 2012-11-04: 100 mg via INTRAVENOUS

## 2012-11-04 MED ORDER — LIDOCAINE HCL (CARDIAC) 20 MG/ML IV SOLN
INTRAVENOUS | Status: DC | PRN
  Administered 2012-11-04: 40 mg via INTRAVENOUS

## 2012-11-04 MED ORDER — UNJURY CHICKEN SOUP POWDER
2.0000 [oz_av] | Freq: Four times a day (QID) | ORAL | Status: DC
  Filled 2012-11-04 (×4): qty 27

## 2012-11-04 SURGICAL SUPPLY — 57 items
ADH SKN CLS APL DERMABOND .7 (GAUZE/BANDAGES/DRESSINGS) ×4
BAND LAP 10.0 W/TUBES (Band) ×1 IMPLANT
BLADE HEX COATED 2.75 (ELECTRODE) ×3 IMPLANT
BLADE SURG 15 STRL LF DISP TIS (BLADE) IMPLANT
BLADE SURG 15 STRL SS (BLADE)
BLADE SURG SZ11 CARB STEEL (BLADE) ×3 IMPLANT
CABLE HIGH FREQUENCY MONO STRZ (ELECTRODE) ×3 IMPLANT
CANISTER SUCTION 2500CC (MISCELLANEOUS) ×3 IMPLANT
CLOTH BEACON ORANGE TIMEOUT ST (SAFETY) ×3 IMPLANT
DECANTER SPIKE VIAL GLASS SM (MISCELLANEOUS) ×6 IMPLANT
DERMABOND ADVANCED (GAUZE/BANDAGES/DRESSINGS) ×2
DERMABOND ADVANCED .7 DNX12 (GAUZE/BANDAGES/DRESSINGS) ×2 IMPLANT
DEVICE SUT QUICK LOAD TK 5 (STAPLE) ×14 IMPLANT
DEVICE SUT TI-KNOT TK 5X26 (MISCELLANEOUS) ×3 IMPLANT
DEVICE SUTURE ENDOST 10MM (ENDOMECHANICALS) IMPLANT
DRAPE CAMERA CLOSED 9X96 (DRAPES) ×3 IMPLANT
ELECT REM PT RETURN 9FT ADLT (ELECTROSURGICAL) ×3
ELECTRODE REM PT RTRN 9FT ADLT (ELECTROSURGICAL) ×2 IMPLANT
GLOVE BIOGEL PI IND STRL 7.0 (GLOVE) ×2 IMPLANT
GLOVE BIOGEL PI INDICATOR 7.0 (GLOVE) ×1
GLOVE SS BIOGEL STRL SZ 7.5 (GLOVE) ×2 IMPLANT
GLOVE SUPERSENSE BIOGEL SZ 7.5 (GLOVE) ×1
GOWN STRL NON-REIN LRG LVL3 (GOWN DISPOSABLE) ×3 IMPLANT
GOWN STRL REIN XL XLG (GOWN DISPOSABLE) ×6 IMPLANT
HOVERMATT SINGLE USE (MISCELLANEOUS) ×3 IMPLANT
KIT BASIN OR (CUSTOM PROCEDURE TRAY) ×3 IMPLANT
MESH HERNIA 1X4 RECT BARD (Mesh General) IMPLANT
MESH HERNIA BARD 1X4 (Mesh General) ×1 IMPLANT
NDL SPNL 22GX3.5 QUINCKE BK (NEEDLE) ×2 IMPLANT
NEEDLE SPNL 22GX3.5 QUINCKE BK (NEEDLE) ×3 IMPLANT
NS IRRIG 1000ML POUR BTL (IV SOLUTION) ×3 IMPLANT
PACK UNIVERSAL I (CUSTOM PROCEDURE TRAY) ×3 IMPLANT
PENCIL BUTTON HOLSTER BLD 10FT (ELECTRODE) ×3 IMPLANT
SCALPEL HARMONIC ACE (MISCELLANEOUS) IMPLANT
SET IRRIG TUBING LAPAROSCOPIC (IRRIGATION / IRRIGATOR) IMPLANT
SLEEVE ADV FIXATION 5X100MM (TROCAR) ×3 IMPLANT
SOLUTION ANTI FOG 6CC (MISCELLANEOUS) ×3 IMPLANT
SPONGE LAP 18X18 X RAY DECT (DISPOSABLE) ×3 IMPLANT
STAPLER VISISTAT 35W (STAPLE) ×3 IMPLANT
SUT ETHIBOND 2 0 SH (SUTURE) ×15
SUT ETHIBOND 2 0 SH 36X2 (SUTURE) ×6 IMPLANT
SUT MNCRL AB 4-0 PS2 18 (SUTURE) ×3 IMPLANT
SUT PROLENE 2 0 CT2 30 (SUTURE) ×3 IMPLANT
SUT SILK 0 (SUTURE) ×3
SUT SILK 0 30XBRD TIE 6 (SUTURE) ×2 IMPLANT
SUT SURGIDAC NAB ES-9 0 48 120 (SUTURE) ×2 IMPLANT
SUT VIC AB 2-0 SH 27 (SUTURE) ×6
SUT VIC AB 2-0 SH 27X BRD (SUTURE) ×2 IMPLANT
SYR 20CC LL (SYRINGE) ×3 IMPLANT
SYR CONTROL 10ML LL (SYRINGE) ×3 IMPLANT
TOWEL OR 17X26 10 PK STRL BLUE (TOWEL DISPOSABLE) ×3 IMPLANT
TROCAR ADV FIXATION 11X100MM (TROCAR) IMPLANT
TROCAR BLADELESS 15MM (ENDOMECHANICALS) ×1 IMPLANT
TROCAR BLADELESS OPT 5 100 (ENDOMECHANICALS) ×6 IMPLANT
TROCAR XCEL NON-BLD 11X100MML (ENDOMECHANICALS) IMPLANT
TUBE CALIBRATION LAPBAND (TUBING) ×3 IMPLANT
TUBING INSUFFLATION 10FT LAP (TUBING) ×3 IMPLANT

## 2012-11-04 NOTE — H&P (View-Only) (Signed)
Chief complaint: Preop lap band  History: Patient returns for a preop visit prior to lap band surgery. She has progressive morbid obesity unresponsive to medical management and comorbidities of chronic joint pain, GERD, borderline hypertension, elevated cholesterol stress urinary incontinence. She has completed her preoperative workup. No concerns on-site for nutritional evaluation. Lab work was unremarkable. Upper GI series showed reflux but no hiatal hernia or other anatomic abnormalities.  Past Medical History  Diagnosis Date  . Hypertension   . High cholesterol   . GERD (gastroesophageal reflux disease)   . Obesity    Past Surgical History  Procedure Laterality Date  . Fasciotomy foot / toe  [id number]  . Novasure ablation     Current Outpatient Prescriptions  Medication Sig Dispense Refill  . Calcium Citrate-Vitamin D (CALCIUM CITRATE + PO) Take 1,200 mg by mouth 3 (three) times daily.      Marland Kitchen lisinopril (PRINIVIL,ZESTRIL) 10 MG tablet Take 10 mg by mouth daily.      . Multiple Vitamin (MULTIVITAMIN) tablet Take 1 tablet by mouth daily.       No current facility-administered medications for this visit.   No Known Allergies  Exam: BP 138/82  Pulse 68  Temp(Src) 98.6 F (37 C) (Temporal)  Resp 14  Ht 5\' 2"  (1.575 m)  Wt 251 lb (113.853 kg)  BMI 45.9 kg/m2 General: Obese otherwise well-appearing African American female Skin: No rash or infection HEENT: No palpable masses. Oropharynx clear. Are not icteric. Lungs: Clear equal breath sounds increased work of breathing Cardiac: Regular rate and rhythm no murmurs Abdomen: Soft and nontender. No hernias or palpable masses or organomegaly Extremities: No edema  Assessment and plan: Ready for lap band surgery. We discussed the surgery again and reviewed the consent form and all her questions were answered. She is starting her preoperative diet. Outpatient  planned.

## 2012-11-04 NOTE — Anesthesia Preprocedure Evaluation (Signed)
Anesthesia Evaluation  Patient identified by MRN, date of birth, ID band Patient awake    Reviewed: Allergy & Precautions, H&P , NPO status , Patient's Chart, lab work & pertinent test results  Airway Mallampati: II TM Distance: <3 FB Neck ROM: Full    Dental no notable dental hx.    Pulmonary neg pulmonary ROS,  breath sounds clear to auscultation  Pulmonary exam normal       Cardiovascular hypertension, Pt. on medications Rhythm:Regular Rate:Normal     Neuro/Psych negative neurological ROS  negative psych ROS   GI/Hepatic negative GI ROS, Neg liver ROS,   Endo/Other  Morbid obesity  Renal/GU negative Renal ROS  negative genitourinary   Musculoskeletal negative musculoskeletal ROS (+)   Abdominal   Peds negative pediatric ROS (+)  Hematology negative hematology ROS (+)   Anesthesia Other Findings   Reproductive/Obstetrics negative OB ROS                           Anesthesia Physical Anesthesia Plan  ASA: II  Anesthesia Plan: General   Post-op Pain Management:    Induction: Intravenous  Airway Management Planned: Oral ETT  Additional Equipment:   Intra-op Plan:   Post-operative Plan: Extubation in OR  Informed Consent: I have reviewed the patients History and Physical, chart, labs and discussed the procedure including the risks, benefits and alternatives for the proposed anesthesia with the patient or authorized representative who has indicated his/her understanding and acceptance.   Dental advisory given  Plan Discussed with: CRNA and Surgeon  Anesthesia Plan Comments:         Anesthesia Quick Evaluation  

## 2012-11-04 NOTE — Interval H&P Note (Signed)
History and Physical Interval Note:  11/04/2012 10:04 AM  Patricia Mcclure  has presented today for surgery, with the diagnosis of morbid obesity  The various methods of treatment have been discussed with the patient and family. After consideration of risks, benefits and other options for treatment, the patient has consented to  Procedure(s): LAPAROSCOPIC GASTRIC BANDING (N/A) as a surgical intervention .  The patient's history has been reviewed, patient examined, no change in status, stable for surgery.  I have reviewed the patient's chart and labs.  Questions were answered to the patient's satisfaction.     Aliviyah Malanga T

## 2012-11-04 NOTE — Anesthesia Postprocedure Evaluation (Signed)
  Anesthesia Post-op Note  Patient: Patricia Mcclure  Procedure(s) Performed: Procedure(s) (LRB): LAPAROSCOPIC GASTRIC BANDING (N/A) INSERTION OF MESH  Patient Location: PACU  Anesthesia Type: General  Level of Consciousness: awake and alert   Airway and Oxygen Therapy: Patient Spontanous Breathing  Post-op Pain: mild  Post-op Assessment: Post-op Vital signs reviewed, Patient's Cardiovascular Status Stable, Respiratory Function Stable, Patent Airway and No signs of Nausea or vomiting  Last Vitals:  Filed Vitals:   11/04/12 1300  BP: 134/70  Pulse: 68  Temp:   Resp: 24    Post-op Vital Signs: stable   Complications: No apparent anesthesia complications

## 2012-11-04 NOTE — Transfer of Care (Signed)
Immediate Anesthesia Transfer of Care Note  Patient: Patricia Mcclure  Procedure(s) Performed: Procedure(s) (LRB): LAPAROSCOPIC GASTRIC BANDING (N/A) INSERTION OF MESH  Patient Location: PACU  Anesthesia Type: General  Level of Consciousness: sedated, patient cooperative and responds to stimulaton  Airway & Oxygen Therapy: Patient Spontanous Breathing and Patient connected to face mask oxgen  Post-op Assessment: Report given to PACU RN and Post -op Vital signs reviewed and stable  Post vital signs: Reviewed and stable  Complications: No apparent anesthesia complications

## 2012-11-04 NOTE — Op Note (Signed)
Preop diagnosis: Morbid obesity   Postop diagnosis: Same  Surgical procedure: Placement of laparoscopic adjustable gastric band  Surgeon:Terry Bolotin M.D.  Assistant.: Estelle Grumbles  Anesthesia: General  Complications: None  EBL: Minimal  Description of procedure: Patient is brought to the operating room, placed in the supine position on the operating table and general anesthesia was induced. Patient did receive preoperative IV antibiotics and subcutaneous heparin and PAS were in place. The abdomen was widely sterilely prepped and draped and correct patient and procedure verified with the patient time out. Trocar sites were infiltrated with local anesthesia. Access was obtained with a 5 mm Optiview trocar in the left upper quadrant without difficulty and pneumoperitoneum was established. There was no evidence of trocar injury. Under direct vision a 5 mm trocar was placed laterally in the right upper quadrant, a 15 mm trocar in the right upper quadrant midclavicular line, and an 5 mm trocar just above and to the left of the umbilicus for the camera port. The patient was placed in steep reverse Trendelenburg position and and through a 5 mm subxiphoid site the [facility] retractor was placed in the left lobe of the liver elevated with actual exposure of the hiatus and upper stomach. There was an obvious hiatal hernia present with dimpling and the stomach could be passed up into this. I initially then proceeded with hiatal hernia repair. The pars flaccida was opened along an avascular area with hook cautery. The peritoneum just anterior to the right crus was incised working up over the top of the esophagus at the EG junction. Careful blunt dissection in the retroesophageal space revealed a small but definite hiatal hernia. Both crura were completely dissected. A single 0 Ethibond suture was placed posteriorly closing the hiatus down to a physiologic size. The sizing tube was then passed orally into the  stomach. It passed easily. With the balloon inflated to 10 cc and pulled back snugly against the hiatus. The peritoneum over the left crus was then incised and careful blunt dissection carried back down along the left crus toward the retrogastric area. The finger dissector was passed  and advanced retrogastric without difficulty and deployed up through the previously dissected area at the angle of Hiss. A flushed AP standard lap band system was introduced into the abdomen. The tubing was placed in the finger dissector and brought retrogastric and the band brought back through the retrogastric tunnel without difficulty. With the sizing tube placed orally into the stomach the band was locked into position without any undue tension and the sizing tube was removed. Holding the tubing toward the patient's feet exposing the small gastric pouch the fundus was sutured up over the band to the pouch with 3 interrupted 2-0 Ethibond sutures. The band appeared to be in excellent position. There was no evidence of bleeding or trocar injury or other problems. The Nathanson retractor was removed under direct vision. All CO2 was evacuated and trochars removed. The tubing which had been brought through the right mid abdominal trocar site was cut and attached to the port to which a piece of Prolene mesh had been sutured to the back. This incision was lengthened slightly in a subcutaneous pocket created. The port was positioned subcutaneously and the tubing introduced bluntly into the abdomen. This incision was closed with subcutaneous running 2-0 Vicryl and all skin incisions were closed with subcutaneous 4-0 Monocryl and Dermabond. Sponge needle and instrument counts were correct. The patient was taken to the PACU in good condition.  Patricia Mcclure  Lurlean Leyden MD, FACS  01/30/2011, 9:11 AM

## 2012-11-05 ENCOUNTER — Telehealth (HOSPITAL_COMMUNITY): Payer: Self-pay

## 2012-11-05 ENCOUNTER — Encounter (HOSPITAL_COMMUNITY): Payer: Self-pay | Admitting: General Surgery

## 2012-11-05 NOTE — Telephone Encounter (Signed)
Patient called Bariatric Nurse Coordinator with questions regarding volume of liquids.  Re-educated patient on home care instructions for lap band.  Including full and clear liquid choices, and volume requirements.  Also discussed supplements and time requirements to ensure maximum absorption.  All patient's questions were answered, patient reassured, and informed to call this RN for any further questions.                  ADJUSTABLE GASTRIC BAND  Home Care Instructions  These instructions are to help you care for yourself when you go home.  Call: If you have any problems.   Call [phone] and ask for the surgeon on call   If you need immediate assistance come to the ER at [facility]. Tell the ER staff that you are a new post-op gastric banding patient   Signs and symptoms to report:   Severe vomiting or nausea o If you cannot handle clear liquids for longer than 1 day, call your surgeon    Abdominal pain which does not get better after taking your pain medication   Fever greater than 100.4 F and chills   Heart rate over 100 beats a minute   Trouble breathing   Chest pain    Redness, swelling, drainage, or foul odor at incision (surgical) sites    If your incisions open or pull apart   Swelling or pain in calf (lower leg)   Diarrhea (Loose bowel movements that happen often), frequent watery, uncontrolled bowel movements   Constipation, (no bowel movements for 3 days) if this happens:  o Take Milk of Magnesia, 2 tablespoons by mouth, 3 times a day for 2 days if needed o Stop taking Milk of Magnesia once you have had a bowel movement o Call your doctor if constipation continues Or o Take Miralax  (instead of Milk of Magnesia) following the label instructions o Stop taking Miralax once you have had a bowel movement o Call your doctor if constipation continues   Anything you think is "abnormal for you"   Normal side effects after surgery:   Unable to sleep at night or unable to  concentrate   Irritability   Being tearful (crying) or depressed These are common complaints, possibly related to your anesthesia, stress of surgery, and change in lifestyle, that usually go away a few weeks after surgery.  If these feelings continue, call your medical doctor.   Wound Care: You may have surgical glue, steri-strips, or staples over your incisions after surgery   Surgical glue:  Looks like a clear film over your incisions and will wear off a little at a time   Steri-strips : Adhesive strips of tape over your incisions. You may notice a yellowish color on the skin under the steri-strips. This is used to make the   steri-strips stick better. Do not pull the steri-strips off - let them fall off   Staples: Staples may be removed before you leave the hospital o If you go home with staples, call [facility] at for an appointment with your surgeon's nurse to have staples removed 10 days after surgery, [phone]   Showering: You may shower two (2) days after your surgery unless your surgeon tells you differently o Wash gently around incisions with warm soapy water, rinse well, and gently pat dry  o If you have a drain (tube from your incision), you may need someone to hold this while you shower  o No tub baths until staples are  removed and incisions are healed     Medications:   Medications should be liquid or crushed if larger than the size of a dime   Extended release pills (medication that releases a little bit at a time through the day) should not be crushed   Depending on the size and number of medications you take, you may need to space (take a few throughout the day)/change the time you take your medications so that you do not over-fill your pouch (smaller stomach)   Make sure you follow-up with your primary care physician to make medication changes needed during rapid weight loss and life-style changes   If you have diabetes, follow up with the doctor that orders  your diabetes medication(s) within one week after surgery and check your blood sugar regularly.   Do not drive while taking narcotics (pain medications)   Do not take acetaminophen (Tylenol) and Roxicet or Lortab Elixir at the same time since these pain medications contain acetaminophen  Diet:                    First 2 Weeks  You will see the nutritionist about two (2) weeks after your surgery. The nutritionist will increase the types of foods you can eat if you are handling liquids well:   If you have severe vomiting or nausea and cannot handle clear liquids lasting longer than 1 day, call your surgeon  For Same Day Surgery Discharge Patients:    The day of surgery drink water only: 2 ounces every 4 hours   If you are handling water, start drinking your high protein shake the next morning For Overnight Stay Patients:    Begin by drinking 2 ounces of a high protein every 3 hours, 5 - 6 times per day   Slowly increase the amount you drink as tolerated   You may find it easier to slowly sip shakes throughout the day.  It is important to get your proteins in first Protein Shake   Drink at least 2 ounces of shake 5-6 times per day   Each serving of protein shakes (usually 8 - 12 ounces) should have a minimum of:  o 15 grams of protein  o And no more than 5 grams of carbohydrate    Goal for protein each day: o Men = 80 grams per day o Women = 60 grams per day   Protein powder may be added to fluids such as non-fat milk or Lactaid milk or Soy milk (limit to 35 grams added protein powder per serving)  Hydration   Slowly increase the amount of water and other clear liquids as tolerated (See Acceptable Fluids)   Slowly increase the amount of protein shake as tolerated     Sip fluids slowly and throughout the day   May use sugar substitutes in small amounts (no more than 6 - 8 packets per day; i.e. Splenda)  Fluid Goal   The first goal is to drink at least 8 ounces of protein shake/drink per day  (or as directed by the nutritionist); some examples of protein shakes are [employer], Dillard[employer], EAS Edge HP, and Unjury. See handout from pre-op Bariatric Education Class: o Slowly increase the amount of protein shake you drink as tolerated o You may find it easier to slowly sip shakes throughout the day o It is important to get your proteins in first   Your fluid goal is to drink 64 - 100 ounces of fluid daily  o It may take a few weeks to build up to this   32 oz (or more) should be clear liquids  And    32 oz (or more) should be full liquids (see below for examples)   Liquids should not contain sugar, caffeine, or carbonation  Clear Liquids:   Water or Sugar-free flavored water (i.e. Fruit H2O, Propel)   Decaffeinated coffee or tea (sugar-free)   Crystal Lite, Wyler's Lite, Minute Maid Lite   Sugar-free Jell-O   Bouillon or broth   Sugar-free Popsicle:   *Less than 20 calories each; Limit 1 per day            Full Liquids: Protein Shakes/Drinks + 2 choices per day of other full liquids   Full liquids must be: o No More Than 12 grams of Carbs per serving  o No More Than 3 grams of Fat per serving   Strained low-fat cream soup   Non-Fat milk   Fat-free Lactaid Milk   Sugar-free yogurt (Dannon Lite & Fit, Greek yogurt)   Vitamins and Minerals   Start 1 day after surgery unless otherwise directed by your surgeon   1 Chewable Multivitamin / Multimineral Supplement with iron (i.e. Centrum for Adults)   Chewable Calcium Citrate with Vitamin D-3 (Example: 3 Chewable Calcium Plus 600 with Vitamin D-3) o Take 500 mg three (3) times a day for a total of 1500 mg each day o Do not take all 3 doses of calcium at one time as it may cause constipation, and you can only absorb 500 mg  at a time  o Do not mix multivitamins containing iron with calcium supplements; take 2 hours apart o Do not substitute Tums (calcium carbonate) for your calcium   Menstruating women and those at  risk for anemia (a blood disease that causes weakness) may need extra iron o Talk with your doctor to see if you need more iron   If you need extra iron: Total daily Iron recommendation (including Vitamins) is 50 to 100 mg Iron/day   Do not stop taking or change any vitamins or minerals until you talk to your nutritionist or surgeon   Your nutritionist and/or surgeon must approve all vitamin and mineral supplements  Activity and Exercise: It is important to continue walking at home.  Limit your physical activity as instructed by your doctor.  During this time, use these guidelines:   Do not lift anything greater than ten (10) pounds for at least two (2) weeks   Do not go back to work or drive until Designer, industrial/product says you can   You may have sex when you feel comfortable  o It is VERY important for female patients to use a reliable birth control method; fertility often increases after surgery  o Do not get pregnant for at least 18 months   Start exercising as soon as your doctor tells you that you can o Make sure your doctor approves any physical activity   Start with a simple walking program   Walk 5-15 minutes each day, 7 days per week.    Slowly increase until you are walking 30-45 minutes per day   Consider joining our BELT program. [phone] or email [email or web address]   Special Instructions Things to remember:   Free counseling is available for you and your family through collaboration between [facility] and [place]. Please call [phone] and leave a message   Use your CPAP when sleeping if this applies to you  Consider buying a medical alert bracelet that says you had lap-band surgery    You will likely have your first fill (fluid added to your band) 6 - 8 weeks after surgery   [facility] has a free Bariatric Surgery Support Group that meets monthly, the 3rd Thursday, 6 pm, [facility] Classrooms You can see classes online at [email or web address]   It  is very important to keep all follow up appointments with your surgeon, nutritionist, primary care physician, and behavioral health practitioner o After the first year, please follow up with your bariatric surgeon and nutritionist at least once a year in order to maintain best weight loss results [facility]: [phone] [facility]: [phone] Bariatric Nurse Coordinator: [phone]

## 2012-11-18 ENCOUNTER — Encounter: Payer: BC Managed Care – PPO | Attending: General Surgery | Admitting: Dietician

## 2012-11-18 ENCOUNTER — Encounter: Payer: Self-pay | Admitting: Dietician

## 2012-11-18 DIAGNOSIS — Z01818 Encounter for other preprocedural examination: Secondary | ICD-10-CM | POA: Insufficient documentation

## 2012-11-18 DIAGNOSIS — Z713 Dietary counseling and surveillance: Secondary | ICD-10-CM | POA: Insufficient documentation

## 2012-11-18 NOTE — Progress Notes (Signed)
Bariatric Class:  Appt start time: 1330 end time:  1630.  2 Week Post-Operative Nutrition Class  Patient was seen on 11/18/2012 for Post-Operative Nutrition education at [facility].   Surgery date: 11/04/12 Surgery type: LAGB Start weight at [facility]: 251.2 lbs (09/09/12) Goal weight: 160 lbs  Weight today: 231.5 lbs Weight change: 19 lbs Total weight lost: 19.7 lbs BMI: 42.3  TANITA  BODY COMP RESULTS  10/09/12 11/18/12   BMI (kg/m^2) 45.8 42.3   Fat Mass (lbs) 125.5 111.5   Fat Free Mass (lbs) 125.0 120.0   Total Body Water (lbs) 91.5 88.0    The following the learning objectives were met by the patient during this course:   Identifies Phase 3A (Soft, High Proteins) Dietary Goals and will begin from 2 weeks post-operatively to 2 months post-operatively  Identifies appropriate sources of fluids and proteins   States protein recommendations and appropriate sources post-operatively  Identifies the need for appropriate texture modifications, mastication, and bite sizes when consuming solids  Identifies appropriate multivitamin and calcium sources post-operatively  Describes the need for physical activity post-operatively and will follow MD recommendations  States when to call healthcare provider regarding medication questions or post-operative complications  Handouts given during class include:  Phase 3A: Soft, High Protein Diet Handout  Band Fill Guidelines Handout  Follow-Up Plan: Patient will follow-up at [facility] in 6 weeks for 2 months post-op nutrition visit for diet advancement per MD.

## 2012-11-21 ENCOUNTER — Encounter (INDEPENDENT_AMBULATORY_CARE_PROVIDER_SITE_OTHER): Payer: Self-pay | Admitting: General Surgery

## 2012-11-21 ENCOUNTER — Ambulatory Visit (INDEPENDENT_AMBULATORY_CARE_PROVIDER_SITE_OTHER): Payer: BC Managed Care – PPO | Admitting: General Surgery

## 2012-11-21 NOTE — Progress Notes (Signed)
History: Patient returns for her first postop visit 3 weeks following lap band placement. She reports no problems. No dysphagia or pain. She is just starting her regular diet. She does feel some moderate restriction.  Exam: BP 114/86  Pulse 71  Temp(Src) 97.4 F (36.3 C) (Temporal)  Resp 16  Ht 5\' 2"  (1.575 m)  Wt 231 lb (104.781 kg)  BMI 42.24 kg/m2  SpO2 98%  LMP 07/28/2010 Weight loss 19 pounds since surgery General: Appears well Abdomen: Soft and nontender with well-healed  Assessment and plan: Doing well following lap band placement. We reviewed.an exercise and strategies. She will return in 3 weeks.

## 2012-11-21 NOTE — Patient Instructions (Addendum)
Concentrate on eating very small bites, chewing well, eating very slowly Work up to regular exercise such as brisk walking with a goal of 40 minutes 4 times per week.

## 2012-12-16 ENCOUNTER — Ambulatory Visit (INDEPENDENT_AMBULATORY_CARE_PROVIDER_SITE_OTHER): Payer: BC Managed Care – PPO | Admitting: General Surgery

## 2012-12-16 ENCOUNTER — Encounter: Payer: BC Managed Care – PPO | Admitting: Dietician

## 2012-12-16 ENCOUNTER — Encounter (INDEPENDENT_AMBULATORY_CARE_PROVIDER_SITE_OTHER): Payer: Self-pay | Admitting: General Surgery

## 2012-12-16 NOTE — Patient Instructions (Addendum)
Goals:  Follow Phase 3B: High Protein + Non-Starchy Vegetables  Eat 3-6 small meals/snacks, every 3-5 hrs  Increase lean protein foods to meet 60g goal  Increase fluid intake to 64oz +  Avoid drinking 15 minutes before, during and 30 minutes after eating  Aim for >30 min of physical activity daily  

## 2012-12-16 NOTE — Progress Notes (Signed)
  Follow-up visit: 6 Weeks Post-Operative LAGB Surgery  Medical Nutrition Therapy:  Appt start time: 0930 end time:  1000.  Primary concerns today: Post-operative Bariatric Surgery Nutrition Management. Had some chicken over the weekend that didn't get stuck, but made her have a "mucosy" feeling in her throat. Tolerating every thing else fine. Starting to feel hungry around 7 PM, not too hungry during the day, and gets full quickly.   Surgery date: 11/04/12 Surgery type: LAGB Start weight at [facility]: 251.2 lbs (09/09/12) Goal weight: 160 lbs  Weight today: 223 lbs Weight change: 8.5 lbs Total weight lost: 28.2 lbs BMI: 40.8  TANITA  BODY COMP RESULTS  10/09/12 11/18/12 12/16/12   BMI (kg/m^2) 45.8 42.3 40.8   Fat Mass (lbs) 125.5 111.5 104.5   Fat Free Mass (lbs) 125.0 120.0 118.5   Total Body Water (lbs) 91.5 88.0 87.0   24-hr recall: B (AM): protein shake OR eggs and [place] sausage Snk (AM): string cheese rarely  L (PM): lean protein ([place]) with green beans or zucchini or 2-3 oz of fish  Snk (PM): cup of greek yogurt  D (PM): protein and veggie Snk (PM): none  Fluid intake: 64-96 oz of water Estimated total protein intake: 70-80 g   Medications: none, not longer taking lisinopril  Supplementation: taking MVI and calcium  Using straws: no Drinking while eating: no Hair loss: no Carbonated beverages: no N/V/D/C: having some constipation, taking Milk of Magnesia every 3 days if not having a bowel movement Dumping syndrome: no Last Lap-Band fill: will be getting first one today   Recent physical activity:  Walking 3 x week for 30 minutes  Progress Towards Goal(s):  In progress.  Handouts given during visit include:  Phase IIIB High Protein + Vegetables    Nutritional Diagnosis:  [place]-3.3 Overweight/obesity related to past poor dietary habits and physical inactivity as evidenced by patient w/ recent LAGB surgery following dietary guidelines for continued weight loss.     Intervention:  Nutrition education/diet advancement.  Monitoring/Evaluation:  Dietary intake, exercise, lap band fills, and body weight. Follow up in 1 month for 3 month post-op visit.

## 2012-12-16 NOTE — Progress Notes (Signed)
Chief complaint: Followup lap band  History: Patient returns for her second postoperative visit following LAP-BAND placement 11/04/2012. She continues to do very well. She still feels significant restriction particularly in the morning when she will leak just a couple of bites and feel full and that she is able to be somewhat more as the day goes on but still with early satiety. She had one episode of chicken sticking briefly. She is exercising regularly.  Exam: BP 128/74  Pulse 82  Temp(Src) 97.6 F (36.4 C) (Temporal)  Resp 16  Ht 5\' 2"  (1.575 m)  Wt 224 lb (101.606 kg)  BMI 40.96 kg/m2 Weight loss 7 pounds since last visit, 26 pounds total  General: Appears well Abdomen: Soft nontender port site fine with incision well-healed  Assessment and plan: Status post lap band placement. She has still good restriction and excellent ongoing weight loss. We decided to defer her initial fill today if she is doing so well. Return in one month.

## 2013-01-13 ENCOUNTER — Encounter: Payer: BC Managed Care – PPO | Attending: General Surgery | Admitting: Dietician

## 2013-01-13 DIAGNOSIS — Z713 Dietary counseling and surveillance: Secondary | ICD-10-CM | POA: Insufficient documentation

## 2013-01-13 DIAGNOSIS — Z01818 Encounter for other preprocedural examination: Secondary | ICD-10-CM | POA: Insufficient documentation

## 2013-01-13 NOTE — Progress Notes (Signed)
  Follow-up visit: 12 Weeks Post-Operative LAGB Surgery  Medical Nutrition Therapy:  Appt start time: 1100 end time:  1115.  Primary concerns today: Post-operative Bariatric Surgery Nutrition Management. States she can eat more at night than during the day but still sticking with mostly protein. Tolerating all foods without problems.   Surgery date: 11/04/12 Surgery type: LAGB Start weight at [facility]: 251.2 lbs (09/09/12) Goal weight: 160 lbs % Weight goal achieved: 39%  Weight today: 215.5 lbs Weight change: 7.5 lbs Total weight lost: 35.7 lbs BMI: 39.4  TANITA  BODY COMP RESULTS  10/09/12 11/18/12 12/16/12 01/13/13   BMI (kg/m^2) 45.8 42.3 40.8 39.4   Fat Mass (lbs) 125.5 111.5 104.5 96.5   Fat Free Mass (lbs) 125.0 120.0 118.5 119.0   Total Body Water (lbs) 91.5 88.0 87.0 87   24-hr recall: B (AM): protein shake OR eggs and [place] sausage Snk (AM): protein or fiber one bar L (PM): lean protein ([place]) with green beans or zucchini or 2-3 oz of fish  Snk (PM):none  D (PM): protein and veggie Snk (PM): fiber one or protein bar  Fluid intake: 64-96 oz of water, hot tea at nightime Estimated total protein intake: 70-80 g   Medications: none, not longer taking lisinopril  Supplementation: taking MVI and calcium  Using straws: no Drinking while eating: no Hair loss: no Carbonated beverages: no N/V/D/C: having some constipation helped with fiber one bar and chia seeds Last Lap-Band fill: has never had a fill but will be following up with doctor Thursday  Recent physical activity:  Walking 3 x week for 30 minutes  Progress Towards Goal(s):  In progress.  Handouts given during visit include:  Phase IIIB High Protein + Vegetables    Nutritional Diagnosis:  [place]-3.3 Overweight/obesity related to past poor dietary habits and physical inactivity as evidenced by patient w/ recent LAGB surgery following dietary guidelines for continued weight loss.    Intervention:  Nutrition  education/diet advancement.  Monitoring/Evaluation:  Dietary intake, exercise, lap band fills, and body weight. Follow up in 3 months for 6 month post-op visit.

## 2013-01-13 NOTE — Patient Instructions (Signed)
Goals:  Follow Phase 3B: High Protein + Non-Starchy Vegetables  Eat 3-6 small meals/snacks, every 3-5 hrs  Increase lean protein foods to meet 60g goal  Increase fluid intake to 64oz +  Avoid drinking 15 minutes before, during and 30 minutes after eating  Aim for >30 min of physical activity daily  

## 2013-01-15 ENCOUNTER — Encounter (INDEPENDENT_AMBULATORY_CARE_PROVIDER_SITE_OTHER): Payer: Self-pay

## 2013-01-15 ENCOUNTER — Ambulatory Visit (INDEPENDENT_AMBULATORY_CARE_PROVIDER_SITE_OTHER): Payer: BC Managed Care – PPO | Admitting: Physician Assistant

## 2013-01-15 VITALS — BP 130/76 | HR 68 | Resp 16 | Ht 62.0 in | Wt 212.6 lb

## 2013-01-15 DIAGNOSIS — Z4651 Encounter for fitting and adjustment of gastric lap band: Secondary | ICD-10-CM

## 2013-01-15 NOTE — Progress Notes (Signed)
  HISTORY: Patricia Mcclure is a 42 y.o.female who received an AP-Standard lap-band in August 2014 by Dr. Johna Sheriff. She comes in with 11 lbs further weight loss since her last visit one month ago. She describes having very little hunger until late in the day. She usually has a protein shake for breakfast with another at lunch or perhaps a protein bar, followed by a snack at midafternoon. Nights tend to be the most difficult for her as that's when her hunger engages. She's noticed a small increase in her portion sizes. She denies regurgitation or reflux symptoms.  VITAL SIGNS: Filed Vitals:   01/15/13 0937  BP: 130/76  Pulse: 68  Resp: 16    PHYSICAL EXAM: Physical exam reveals a very well-appearing 42 y.o.female in no apparent distress Neurologic: Awake, alert, oriented Psych: Bright affect, conversant Respiratory: Breathing even and unlabored. No stridor or wheezing Abdomen: Soft, nontender, nondistended to palpation. Incisions well-healed. No incisional hernias. Port easily palpated. Extremities: Atraumatic, good range of motion.  ASSESMENT: 42 y.o.  female  s/p AP-Standard lap-band.   PLAN: The patient's port was accessed with a 20G Huber needle without difficulty. Clear fluid was aspirated and 1 mL saline was added to the port. The patient was able to swallow water without difficulty following the procedure and was instructed to take clear liquids for the next 24-48 hours and advance slowly as tolerated.

## 2013-01-15 NOTE — Patient Instructions (Signed)

## 2013-01-22 ENCOUNTER — Other Ambulatory Visit: Payer: Self-pay

## 2013-02-19 ENCOUNTER — Ambulatory Visit (INDEPENDENT_AMBULATORY_CARE_PROVIDER_SITE_OTHER): Payer: BC Managed Care – PPO | Admitting: Physician Assistant

## 2013-02-19 ENCOUNTER — Encounter (INDEPENDENT_AMBULATORY_CARE_PROVIDER_SITE_OTHER): Payer: Self-pay

## 2013-02-19 VITALS — BP 118/70 | HR 70 | Resp 18 | Ht 62.0 in | Wt 206.4 lb

## 2013-02-19 DIAGNOSIS — Z4651 Encounter for fitting and adjustment of gastric lap band: Secondary | ICD-10-CM

## 2013-02-19 NOTE — Patient Instructions (Signed)

## 2013-02-19 NOTE — Progress Notes (Signed)
  HISTORY: Patricia Mcclure is a 42 y.o.female who received an AP-Standard lap-band in August 2014 by Dr. Johna Sheriff. She comes in with 6 lbs weight loss since her last visit. She is exercising regularly and is watching what she eats. She said since the last fill, she's had a bit of increased hunger and she's able to eat bread. She's had no incidents of regurgitation or reflux. She would like a fill today.  VITAL SIGNS: Filed Vitals:   02/19/13 1147  BP: 118/70  Pulse: 70  Resp: 18    PHYSICAL EXAM: Physical exam reveals a very well-appearing 42 y.o.female in no apparent distress Neurologic: Awake, alert, oriented Psych: Bright affect, conversant Respiratory: Breathing even and unlabored. No stridor or wheezing Abdomen: Soft, nontender, nondistended to palpation. Incisions well-healed. No incisional hernias. Port easily palpated. Extremities: Atraumatic, good range of motion.  ASSESMENT: 42 y.o.  female  s/p AP-Standard lap-band.   PLAN: The patient's port was accessed with a 20G Huber needle without difficulty. Clear fluid was aspirated and 0.75 mL saline was added to the port. The patient was able to swallow water without difficulty following the procedure and was instructed to take clear liquids for the next 24-48 hours and advance slowly as tolerated.

## 2013-04-02 ENCOUNTER — Encounter (INDEPENDENT_AMBULATORY_CARE_PROVIDER_SITE_OTHER): Payer: Self-pay

## 2013-04-02 ENCOUNTER — Ambulatory Visit (INDEPENDENT_AMBULATORY_CARE_PROVIDER_SITE_OTHER): Payer: BC Managed Care – PPO | Admitting: Physician Assistant

## 2013-04-02 VITALS — BP 118/70 | HR 72 | Temp 97.2°F | Resp 14 | Ht 62.0 in | Wt 197.0 lb

## 2013-04-02 DIAGNOSIS — Z4651 Encounter for fitting and adjustment of gastric lap band: Secondary | ICD-10-CM

## 2013-04-02 NOTE — Patient Instructions (Signed)

## 2013-04-02 NOTE — Progress Notes (Signed)
  HISTORY: Patricia Mcclure is a 43 y.o.female who received an AP-Standard lap-band in August 2014 by Dr. Excell Seltzer. She comes in with 9 lbs further weight loss since her last visit in early December. She has lost 53 lbs since surgery. She had one episode of regurgitation after 3 bites of rather dry lamb meat but no other episodes. She is exercising daily and is using a FitBit and social media to help her with her progress. She noticed a decrease in hunger and portion size after her last fill but that has slowly begun to taper. She avoids slider foods unless they are used as an isolated snack.  VITAL SIGNS: Filed Vitals:   04/02/13 0933  BP: 118/70  Pulse: 72  Temp: 97.2 F (36.2 C)  Resp: 14    PHYSICAL EXAM: Physical exam reveals a very well-appearing 43 y.o.female in no apparent distress Neurologic: Awake, alert, oriented Psych: Bright affect, conversant Respiratory: Breathing even and unlabored. No stridor or wheezing Abdomen: Soft, nontender, nondistended to palpation. Incisions well-healed. No incisional hernias. Port easily palpated. Extremities: Atraumatic, good range of motion.  ASSESMENT: 43 y.o.  female  s/p AP-Standard lap-band.   PLAN: The patient's port was accessed with a 20G Huber needle without difficulty. Clear fluid was aspirated and 0.5 mL saline was added to the port. The patient was able to swallow water without difficulty following the procedure and was instructed to take clear liquids for the next 24-48 hours and advance slowly as tolerated.

## 2013-04-15 ENCOUNTER — Encounter: Payer: BC Managed Care – PPO | Attending: General Surgery | Admitting: Dietician

## 2013-04-15 DIAGNOSIS — Z713 Dietary counseling and surveillance: Secondary | ICD-10-CM | POA: Insufficient documentation

## 2013-04-15 DIAGNOSIS — Z6835 Body mass index (BMI) 35.0-35.9, adult: Secondary | ICD-10-CM | POA: Insufficient documentation

## 2013-04-15 DIAGNOSIS — E669 Obesity, unspecified: Secondary | ICD-10-CM | POA: Insufficient documentation

## 2013-04-15 DIAGNOSIS — Z9884 Bariatric surgery status: Secondary | ICD-10-CM | POA: Insufficient documentation

## 2013-04-15 NOTE — Patient Instructions (Addendum)
Goals:  Follow Phase 3B: High Protein + Non-Starchy Vegetables  Eat 3-6 small meals/snacks, every 3-5 hrs  Continue having lean protein foods to meet 60 g goal  Continue having fluid intake to 64oz +  Avoid drinking 15 minutes before, during and 30 minutes after eating  Continue to get >30 min of physical activity most days of the week  Add 15 grams of carbohydrate (fruit, whole grain, starchy vegetable) with meals if needed, still eat the protein first, then vegetables

## 2013-04-15 NOTE — Progress Notes (Signed)
  Follow-up visit: 12 Weeks Post-Operative LAGB Surgery  Medical Nutrition Therapy:  Appt start time: 820 end time:  845.  Primary concerns today: Post-operative Bariatric Surgery Nutrition Management. Returns today with a 22.5 lb weight loss. Had a fill on 04/02/13 and has thrown up a few times since then when having chicken early in the daytime. States that it is likely d/t taking bites that are too large.   Getting in fluid and protein requirement and exercising for 75 minutes 5 x week. Overall doing very well.    Surgery date: 11/04/12 Surgery type: LAGB Start weight at [facility]: 251.2 lbs (09/09/12) Goal weight: 160 lbs % Weight goal achieved: 64%  Weight today: 193.0 lbs  Weight change: 22.5 lbs Total weight lost: 58.2 lbs   TANITA  BODY COMP RESULTS  10/09/12 11/18/12 12/16/12 01/13/13 04/15/13   BMI (kg/m^2) 45.8 42.3 40.8 39.4 35.3   Fat Mass (lbs) 125.5 111.5 104.5 96.5 78.0   Fat Free Mass (lbs) 125.0 120.0 118.5 119.0 115.0   Total Body Water (lbs) 91.5 88.0 87.0 87 84.0   24-hr recall: B (AM): atkins protein shake OR yogurt (12-15 g) Snk (AM): sometimes protein bar L (PM): beans with cheese or chili Snk (PM):none  D (PM): protein and veggie Snk (PM): fiber one or protein bar  Fluid intake: 64 oz of water, hot tea at nightime Estimated total protein intake: 60-70 g (keeps track on My Fitness Pal)  Medications: none  Supplementation: taking MVI and calcium  Using straws: no Drinking while eating: no Hair loss: no Carbonated beverages: no N/V/D/C: vomited about 4 x since fill when having solid protein early in the day, constipation resolved Last Lap-Band fill: 04/02/2013, 0.5 cc, feels like she is probably in the green zone   Recent physical activity:  Walking 5 x week for 75 minutes for 5 miles  Progress Towards Goal(s):  In progress.    Nutritional Diagnosis:  [place]-3.3 Overweight/obesity related to past poor dietary habits and physical inactivity as evidenced by  patient w/ recent LAGB surgery following dietary guidelines for continued weight loss.    Intervention:  Nutrition education/diet reinforcement.  Monitoring/Evaluation:  Dietary intake, exercise, lap band fills, and body weight. Follow up in 3 months for 8 month post-op visit.

## 2013-05-07 ENCOUNTER — Encounter (INDEPENDENT_AMBULATORY_CARE_PROVIDER_SITE_OTHER): Payer: Self-pay

## 2013-05-07 ENCOUNTER — Ambulatory Visit (INDEPENDENT_AMBULATORY_CARE_PROVIDER_SITE_OTHER): Payer: BC Managed Care – PPO | Admitting: Physician Assistant

## 2013-05-07 VITALS — BP 128/80 | HR 72 | Temp 98.6°F | Resp 14 | Ht 62.0 in | Wt 190.4 lb

## 2013-05-07 DIAGNOSIS — Z9884 Bariatric surgery status: Secondary | ICD-10-CM

## 2013-05-07 NOTE — Patient Instructions (Signed)
Return in one month. Focus on good food choices as well as physical activity. Return sooner if you have an increase in hunger, portion sizes or weight. Return also for difficulty swallowing, night cough, reflux.

## 2013-05-07 NOTE — Progress Notes (Signed)
  HISTORY: Patricia Mcclure is a 43 y.o.female who received an AP-Standard lap-band in August 2014 by Dr. Excell Seltzer. She has lost 6.6 lbs since her last visit in January and 59.6 lbs since her surgery. She said her highest pre-op weight was 272, adding a further 22 lbs to her weight loss tally. She reports her hunger being in good control and her portion sizes remain small. She has met with nutrition since our last visit as well. She reports a few episodes of obstruction when eating which she attributes to inattentive eating. She is still learning how to chew adequately and take smaller bites. Otherwise she does just fine. Solids are harder for her to tolerate in the early hours of the day. She is walking 5 miles a day with a goal of running a 5k in mid-April.  VITAL SIGNS: Filed Vitals:   05/07/13 0902  BP: 128/80  Pulse: 72  Temp: 98.6 F (37 C)  Resp: 14    PHYSICAL EXAM: Physical exam reveals a very well-appearing 43 y.o.female in no apparent distress Neurologic: Awake, alert, oriented Psych: Bright affect, conversant Respiratory: Breathing even and unlabored. No stridor or wheezing Extremities: Atraumatic, good range of motion. Skin: Warm, Dry, no rashes Musculoskeletal: Normal gait, Joints normal  ASSESMENT: 43 y.o.  female  s/p AP-Standard lap-band.   PLAN: I congratulated on her stellar progress. She has lost 50 % of her EBW and she is only 6 months into her post-op period. As she's clearly in the green zone, I recommended that we hold off on a fill today and re-evaluate in a month. She voiced agreement. We talked about eating behaviors and how to avoid obstruction while eating.

## 2013-06-04 ENCOUNTER — Ambulatory Visit (INDEPENDENT_AMBULATORY_CARE_PROVIDER_SITE_OTHER): Payer: BC Managed Care – PPO | Admitting: Physician Assistant

## 2013-06-04 ENCOUNTER — Encounter (INDEPENDENT_AMBULATORY_CARE_PROVIDER_SITE_OTHER): Payer: Self-pay

## 2013-06-04 VITALS — BP 122/82 | HR 60 | Temp 99.3°F | Resp 14 | Ht 62.0 in | Wt 185.2 lb

## 2013-06-04 DIAGNOSIS — Z4651 Encounter for fitting and adjustment of gastric lap band: Secondary | ICD-10-CM

## 2013-06-04 NOTE — Progress Notes (Signed)
  HISTORY: Patricia Mcclure is a 43 y.o.female who received an AP-Standard lap-band in August 2014 by Dr. Excell Seltzer. She comes in this morning with 5 lbs weight loss since her last visit a month ago. She has no complaints of regurgitation or reflux. She has lost 65 lbs since surgery. She has noticed hunger at night. Upon further discussion, she usually eats at about 8 pm and takes an hour to eat a dinner plate sized portion. She usually has little hunger during the course of the day. She reports her weight hasn't changed in the past three weeks or so.  VITAL SIGNS: Filed Vitals:   06/04/13 1004  BP: 122/82  Pulse: 60  Temp: 99.3 F (37.4 C)  Resp: 14    PHYSICAL EXAM: Physical exam reveals a very well-appearing 43 y.o.female in no apparent distress Neurologic: Awake, alert, oriented Psych: Bright affect, conversant Respiratory: Breathing even and unlabored. No stridor or wheezing Abdomen: Soft, nontender, nondistended to palpation. Incisions well-healed. No incisional hernias. Port easily palpated. Extremities: Atraumatic, good range of motion.  ASSESMENT: 43 y.o.  female  s/p AP-Standard lap-band.   PLAN: The patient's port was accessed with a 20G Huber needle without difficulty. Clear fluid was aspirated and 0.25 mL saline was added to the port. The patient was able to swallow water without difficulty following the procedure and was instructed to take clear liquids for the next 24-48 hours and advance slowly as tolerated. We talked about eating a little earlier in the evening and taking only 30 minutes or so to finish a small meal. We also talked about making the food the focus of the meal and to avoid grazing while watching TV, etc. She voiced understanding and agreement.

## 2013-06-04 NOTE — Patient Instructions (Signed)

## 2013-07-02 ENCOUNTER — Ambulatory Visit (INDEPENDENT_AMBULATORY_CARE_PROVIDER_SITE_OTHER): Payer: BC Managed Care – PPO | Admitting: Physician Assistant

## 2013-07-02 ENCOUNTER — Encounter (INDEPENDENT_AMBULATORY_CARE_PROVIDER_SITE_OTHER): Payer: Self-pay | Admitting: Physician Assistant

## 2013-07-02 DIAGNOSIS — Z9884 Bariatric surgery status: Secondary | ICD-10-CM

## 2013-07-02 NOTE — Progress Notes (Signed)
  HISTORY: Patricia Mcclure is a 43 y.o.female who received an AP-Standard lap-band in August 2014 by Dr. Excell Seltzer. She comes to clinic today with 3 lbs further weight loss. She has lost 68 lbs since surgery. She has just completed her first half marathon this past Sunday and is scheduled to participate in two more in May. She has noticed her clothes fitting much differently despite a relatively modest decrease in weight. Her hunger is under good control as are her portion sizes but her hardest time is in the evening. She has no persistent regurgitation or reflux symptoms. She does not want a fill today as she wants to get in adequate nutrition for her running efforts.  VITAL SIGNS: Filed Vitals:   07/02/13 1003  BP: 122/82  Pulse: 76  Temp: 97.6 F (36.4 C)  Resp: 14    PHYSICAL EXAM: Physical exam reveals a very well-appearing 43 y.o.female in no apparent distress Neurologic: Awake, alert, oriented Psych: Bright affect, conversant Respiratory: Breathing even and unlabored. No stridor or wheezing Extremities: Atraumatic, good range of motion. Skin: Warm, Dry, no rashes Musculoskeletal: Normal gait, Joints normal  ASSESMENT: 43 y.o.  female  s/p AP-Standard lap-band.   PLAN: We deferred a fill today as she's in the green zone. We'll have her return next month for routine follow-up. I asked her to contact us should she have increased hunger or obstructive symptoms.

## 2013-07-02 NOTE — Patient Instructions (Signed)
Return in one month. Focus on good food choices as well as physical activity. Return sooner if you have an increase in hunger, portion sizes or weight. Return also for difficulty swallowing, night cough, reflux.

## 2013-07-09 ENCOUNTER — Encounter (INDEPENDENT_AMBULATORY_CARE_PROVIDER_SITE_OTHER): Payer: BC Managed Care – PPO

## 2013-07-14 ENCOUNTER — Ambulatory Visit: Payer: BC Managed Care – PPO | Admitting: Dietician

## 2013-07-30 ENCOUNTER — Ambulatory Visit (INDEPENDENT_AMBULATORY_CARE_PROVIDER_SITE_OTHER): Payer: BC Managed Care – PPO | Admitting: Physician Assistant

## 2013-07-30 ENCOUNTER — Encounter (INDEPENDENT_AMBULATORY_CARE_PROVIDER_SITE_OTHER): Payer: Self-pay

## 2013-07-30 VITALS — BP 124/80 | HR 90 | Resp 16 | Ht 62.0 in | Wt 174.0 lb

## 2013-07-30 DIAGNOSIS — Z9884 Bariatric surgery status: Secondary | ICD-10-CM

## 2013-07-30 NOTE — Progress Notes (Signed)
  HISTORY: Joyleen Haselton is a 43 y.o.female who received an AP-Standard lap-band in August 2014 by Dr. Excell Seltzer. She comes in with 8 lbs further weight loss since her last visit. She's completed two more half-marathons since her visit with [place] one month ago. She's doing well with her portion sizes and hunger level. She is 75 lbs down from pre-op.  VITAL SIGNS: Filed Vitals:   07/30/13 0951  BP: 124/80  Pulse: 90  Resp: 16    PHYSICAL EXAM: Physical exam reveals a very well-appearing 43 y.o.female in no apparent distress Neurologic: Awake, alert, oriented Psych: Bright affect, conversant Respiratory: Breathing even and unlabored. No stridor or wheezing Extremities: Atraumatic, good range of motion. Skin: Warm, Dry, no rashes Musculoskeletal: Normal gait, Joints normal  ASSESMENT: 43 y.o.  female  s/p AP-Standard lap-band.   PLAN: She is pleased with her weight loss and level of restriction. She'd like to leave things where they are at this point as she feels like she's in the green zone. She wants to have adequate nutrition to support her physical activity. We'll have her back in one month for routine follow-up.

## 2013-07-30 NOTE — Patient Instructions (Signed)
Return in one month. Focus on good food choices as well as physical activity. Return sooner if you have an increase in hunger, portion sizes or weight. Return also for difficulty swallowing, night cough, reflux.

## 2013-08-19 ENCOUNTER — Other Ambulatory Visit (HOSPITAL_COMMUNITY): Payer: Self-pay | Admitting: Family Medicine

## 2013-08-19 DIAGNOSIS — Z1231 Encounter for screening mammogram for malignant neoplasm of breast: Secondary | ICD-10-CM

## 2013-08-27 ENCOUNTER — Ambulatory Visit (INDEPENDENT_AMBULATORY_CARE_PROVIDER_SITE_OTHER): Payer: BC Managed Care – PPO | Admitting: Physician Assistant

## 2013-08-27 ENCOUNTER — Encounter (INDEPENDENT_AMBULATORY_CARE_PROVIDER_SITE_OTHER): Payer: Self-pay

## 2013-08-27 VITALS — BP 134/80 | HR 78 | Ht 62.0 in | Wt 177.0 lb

## 2013-08-27 DIAGNOSIS — Z4651 Encounter for fitting and adjustment of gastric lap band: Secondary | ICD-10-CM

## 2013-08-27 NOTE — Patient Instructions (Signed)

## 2013-08-27 NOTE — Progress Notes (Signed)
  HISTORY: Arma Reining is a 43 y.o.female who received an AP-Standard lap-band in August 2014 by Dr. Excell Seltzer. She comes in with 3 lbs weight gain since her last visit one month ago. She reports decreasing her running due to the increasing heat outside. She continues to train early in the morning to prepare for a half marathon in September and a full marathon in October. She reports an increase in her portion size and her hunger and believes she needs a fill. She's having no regurgitation or reflux symptoms.  VITAL SIGNS: Filed Vitals:   08/27/13 1509  BP: 134/80  Pulse: 78    PHYSICAL EXAM: Physical exam reveals a very well-appearing 43 y.o.female in no apparent distress Neurologic: Awake, alert, oriented Psych: Bright affect, conversant Respiratory: Breathing even and unlabored. No stridor or wheezing Abdomen: Soft, nontender, nondistended to palpation. Incisions well-healed. No incisional hernias. Port easily palpated. Extremities: Atraumatic, good range of motion.  ASSESMENT: 43 y.o.  female  s/p AP-Standard lap-band.   PLAN: The patient's port was accessed with a 20G Huber needle without difficulty. Clear fluid was aspirated and 0.5 mL saline was added to the port. The patient was able to swallow water without difficulty following the procedure and was instructed to take clear liquids for the next 24-48 hours and advance slowly as tolerated.

## 2013-09-17 ENCOUNTER — Ambulatory Visit (HOSPITAL_COMMUNITY)
Admission: RE | Admit: 2013-09-17 | Discharge: 2013-09-17 | Disposition: A | Discharge status: Home / Self Care | Payer: BC Managed Care – PPO | Source: Ambulatory Visit | Attending: Family Medicine | Admitting: Family Medicine

## 2013-09-17 ENCOUNTER — Other Ambulatory Visit (HOSPITAL_COMMUNITY): Payer: Self-pay | Admitting: Family Medicine

## 2013-09-17 DIAGNOSIS — Z1231 Encounter for screening mammogram for malignant neoplasm of breast: Secondary | ICD-10-CM

## 2013-10-01 ENCOUNTER — Ambulatory Visit (INDEPENDENT_AMBULATORY_CARE_PROVIDER_SITE_OTHER): Payer: BC Managed Care – PPO | Admitting: Physician Assistant

## 2013-10-01 ENCOUNTER — Encounter (INDEPENDENT_AMBULATORY_CARE_PROVIDER_SITE_OTHER): Payer: Self-pay

## 2013-10-01 VITALS — BP 140/80 | HR 72 | Temp 98.9°F | Resp 14 | Ht 62.0 in | Wt 166.2 lb

## 2013-10-01 DIAGNOSIS — Z9884 Bariatric surgery status: Secondary | ICD-10-CM

## 2013-10-01 NOTE — Patient Instructions (Signed)
Return in one month. Focus on good food choices as well as physical activity. Return sooner if you have an increase in hunger, portion sizes or weight. Return also for difficulty swallowing, night cough, reflux.

## 2013-10-01 NOTE — Progress Notes (Signed)
  HISTORY: Patricia Mcclure is a 43 y.o.female who received an AP-Standard lap-band in August 2014 by Dr. Excell Seltzer. The patient has lost 11 lbs since their last visit in June, and has lost 84 lbs since surgery. She is doing well since her last fill. She's noticed a change in her hunger and portion sizes but is still able to take solid proteins. She has no issues with regurgitation or reflux. She is going to start training for a full marathon in November. She runs at least five miles a day five days a week now. She thinks her band is just fine where it is and she doesn't want a fill.  VITAL SIGNS: Filed Vitals:   10/01/13 0947  BP: 140/80  Pulse: 72  Temp: 98.9 F (37.2 C)  Resp: 14    PHYSICAL EXAM: Physical exam reveals a very well-appearing 43 y.o.female in no apparent distress Neurologic: Awake, alert, oriented Psych: Bright affect, conversant Respiratory: Breathing even and unlabored. No stridor or wheezing Extremities: Atraumatic, good range of motion. Skin: Warm, Dry, no rashes Musculoskeletal: Normal gait, Joints normal  ASSESMENT: 43 y.o.  female  s/p AP-Standard lap-band.   PLAN: She's clearly in the green zone. We deferred a fill today. She'll return in one month for her one-year anniversary appointment. We talked about potentially removing some fluid as her marathon training progresses so she's able to get in adequate nutrition and hydration. She voiced agreement.

## 2013-10-29 ENCOUNTER — Encounter (INDEPENDENT_AMBULATORY_CARE_PROVIDER_SITE_OTHER): Payer: Self-pay

## 2013-10-29 ENCOUNTER — Ambulatory Visit (INDEPENDENT_AMBULATORY_CARE_PROVIDER_SITE_OTHER): Payer: BC Managed Care – PPO | Admitting: Physician Assistant

## 2013-10-29 VITALS — BP 136/80 | HR 72 | Temp 98.6°F | Resp 14 | Ht 62.0 in | Wt 161.8 lb

## 2013-10-29 DIAGNOSIS — Z9884 Bariatric surgery status: Secondary | ICD-10-CM

## 2013-10-29 NOTE — Patient Instructions (Signed)
Return in three months. Focus on good food choices as well as physical activity. Return sooner if you have an increase in hunger, portion sizes or weight. Return also for difficulty swallowing, night cough, reflux.   

## 2013-10-29 NOTE — Progress Notes (Signed)
  HISTORY: Ruchama Kubicek is a 43 y.o.female who received an AP-Standard lap-band in August 2014 by Dr. Excell Seltzer. The patient has lost four lbs since their last visit in July, and has lost an impressive 88 lbs since surgery. She is very pleased with where her band is today. She has good hunger control and her portion sizes remain small. She sometimes forgets to eat. She continues to run close to every day with a goal of a marathon in November. She has no complaint of regurgitation or reflux. She does not want a fill today.  VITAL SIGNS: Filed Vitals:   10/29/13 0913  BP: 136/80  Pulse: 72  Temp: 98.6 F (37 C)  Resp: 14    PHYSICAL EXAM: Physical exam reveals a very well-appearing 43 y.o.female in no apparent distress Neurologic: Awake, alert, oriented Psych: Bright affect, conversant Respiratory: Breathing even and unlabored. No stridor or wheezing Extremities: Atraumatic, good range of motion. Skin: Warm, Dry, no rashes Musculoskeletal: Normal gait, Joints normal  ASSESMENT: 43 y.o.  female  s/p AP-Standard lap-band.   PLAN: I congratulated her on her progress. I encouraged her to get adequate nutrition for her level of physical activity and to let us know if she has any difficulty with the band. Dr. Excell Seltzer saw her as well this morning. We'll have her return in three months or sooner if needed.

## 2014-01-28 ENCOUNTER — Encounter (INDEPENDENT_AMBULATORY_CARE_PROVIDER_SITE_OTHER): Payer: BC Managed Care – PPO

## 2014-08-31 ENCOUNTER — Other Ambulatory Visit (HOSPITAL_COMMUNITY): Payer: Self-pay | Admitting: Family Medicine

## 2014-08-31 DIAGNOSIS — Z1231 Encounter for screening mammogram for malignant neoplasm of breast: Secondary | ICD-10-CM

## 2014-09-23 ENCOUNTER — Ambulatory Visit (HOSPITAL_COMMUNITY)
Admission: RE | Admit: 2014-09-23 | Discharge: 2014-09-23 | Disposition: A | Discharge status: Home / Self Care | Payer: BLUE CROSS/BLUE SHIELD | Source: Ambulatory Visit | Attending: Family Medicine | Admitting: Family Medicine

## 2014-09-23 DIAGNOSIS — Z1231 Encounter for screening mammogram for malignant neoplasm of breast: Secondary | ICD-10-CM | POA: Diagnosis present

## 2015-08-29 ENCOUNTER — Other Ambulatory Visit: Payer: Self-pay | Admitting: Family Medicine

## 2015-08-29 ENCOUNTER — Other Ambulatory Visit: Payer: Self-pay

## 2015-08-29 DIAGNOSIS — Z1231 Encounter for screening mammogram for malignant neoplasm of breast: Secondary | ICD-10-CM

## 2015-09-26 ENCOUNTER — Ambulatory Visit
Admission: RE | Admit: 2015-09-26 | Discharge: 2015-09-26 | Disposition: A | Discharge status: Home / Self Care | Payer: BLUE CROSS/BLUE SHIELD | Source: Ambulatory Visit | Attending: Family Medicine | Admitting: Family Medicine

## 2015-09-26 DIAGNOSIS — Z1231 Encounter for screening mammogram for malignant neoplasm of breast: Secondary | ICD-10-CM

## 2016-08-31 ENCOUNTER — Other Ambulatory Visit: Payer: Self-pay | Admitting: Family Medicine

## 2016-08-31 DIAGNOSIS — Z1231 Encounter for screening mammogram for malignant neoplasm of breast: Secondary | ICD-10-CM

## 2016-09-26 ENCOUNTER — Ambulatory Visit
Admission: RE | Admit: 2016-09-26 | Discharge: 2016-09-26 | Disposition: A | Discharge status: Home / Self Care | Payer: BLUE CROSS/BLUE SHIELD | Source: Ambulatory Visit | Attending: Family Medicine | Admitting: Family Medicine

## 2016-09-26 DIAGNOSIS — Z1231 Encounter for screening mammogram for malignant neoplasm of breast: Secondary | ICD-10-CM

## 2017-03-28 ENCOUNTER — Ambulatory Visit
Admission: RE | Admit: 2017-03-28 | Discharge: 2017-03-28 | Disposition: A | Discharge status: Home / Self Care | Payer: BLUE CROSS/BLUE SHIELD | Source: Ambulatory Visit | Attending: Physician Assistant | Admitting: Physician Assistant

## 2017-03-28 ENCOUNTER — Other Ambulatory Visit: Payer: Self-pay | Admitting: Physician Assistant

## 2017-03-28 DIAGNOSIS — M25572 Pain in left ankle and joints of left foot: Secondary | ICD-10-CM

## 2017-08-13 ENCOUNTER — Other Ambulatory Visit: Payer: Self-pay | Admitting: Family Medicine

## 2017-08-13 DIAGNOSIS — Z1231 Encounter for screening mammogram for malignant neoplasm of breast: Secondary | ICD-10-CM

## 2017-10-01 ENCOUNTER — Ambulatory Visit
Admission: RE | Admit: 2017-10-01 | Discharge: 2017-10-01 | Disposition: A | Discharge status: Home / Self Care | Payer: BLUE CROSS/BLUE SHIELD | Source: Ambulatory Visit | Attending: Family Medicine | Admitting: Family Medicine

## 2017-10-01 DIAGNOSIS — Z1231 Encounter for screening mammogram for malignant neoplasm of breast: Secondary | ICD-10-CM

## 2018-11-17 ENCOUNTER — Other Ambulatory Visit: Payer: Self-pay

## 2018-11-17 ENCOUNTER — Ambulatory Visit
Admission: EM | Admit: 2018-11-17 | Discharge: 2018-11-17 | Disposition: A | Discharge status: Home / Self Care | Payer: BLUE CROSS/BLUE SHIELD

## 2018-11-17 DIAGNOSIS — M25562 Pain in left knee: Secondary | ICD-10-CM

## 2018-11-17 DIAGNOSIS — M79672 Pain in left foot: Secondary | ICD-10-CM

## 2018-11-17 MED ORDER — PREDNISONE 50 MG PO TABS: 50.0000 mg | ORAL_TABLET | Freq: Every day | ORAL | 0 refills | Status: DC

## 2018-11-17 MED ORDER — DICLOFENAC SODIUM 1 % TD GEL: 2.0000 g | Freq: Four times a day (QID) | TRANSDERMAL | 0 refills | Status: DC

## 2018-11-17 NOTE — Discharge Instructions (Signed)
Start prednisone as directed, take with food. Add voltaren gel as needed for pain. Ice compress, elevation, knee sleeve during activity. Follow up with sports medicine if symptoms not improving.

## 2018-11-17 NOTE — ED Triage Notes (Signed)
Pt states had lt heal pain a week and a half ago, now having pain shooting up lt leg to lt knees. Denies injury.

## 2018-11-17 NOTE — ED Provider Notes (Signed)
[facility]    CSN: [id number] Arrival date & time: 11/17/18  1633      History   Chief Complaint Chief Complaint  Patient presents with  . Leg Pain    HPI Patricia Mcclure is a 48 y.o. female.   [name] reports pain in her left heel that started a couple weeks ago. The pain in her heel has improved since and now feels more like a soreness and is intermittent. However, she has started to have pain that shoots up her calf from her heel. These shooting pains are worse with the first few steps after sitting or resting and improves the more steps she takes. She has also started to have a constant ache below her knee. Her knee pain is worse with stairs and walking and improves with rest. She denies trauma or injury. She reports she has been working from home for years and does not stand for long periods of time. She denies fever, chest pain, shortness of breath, warmth, swelling, family or personal history of blood clots.      Past Medical History:  Diagnosis Date  . Depression    HX DEPRESSION IN PAST  . GERD (gastroesophageal reflux disease)   . High cholesterol   . Hypertension   . Obesity     Patient Active Problem List   Diagnosis Date Noted  . LAP-BAND surgery status 07/30/2013  . Morbid obesity ([place]) 09/05/2012    Past Surgical History:  Procedure Laterality Date  . FASCIOTOMY FOOT / TOE  [id number]  . INSERTION OF MESH  11/04/2012   Procedure: INSERTION OF MESH;  Surgeon: Edward Jolly, MD;  Location: [facility];  Service: General;;  . LAPAROSCOPIC GASTRIC BANDING N/A 11/04/2012   Procedure: LAPAROSCOPIC GASTRIC BANDING;  Surgeon: Edward Jolly, MD;  Location: [facility];  Service: General;  Laterality: N/A;  . NOVASURE ABLATION      OB History   No obstetric history on file.      Home Medications    Prior to Admission medications   Medication Sig Start Date End Date Taking? Authorizing Provider  atorvastatin (LIPITOR) 10 MG tablet Take 10 mg by  mouth daily.   Yes [provider]  lisinopril (ZESTRIL) 10 MG tablet Take 10 mg by mouth daily.   Yes [provider]  Calcium Citrate-Vitamin D (CALCIUM CITRATE + PO) Take 1,200 mg by mouth 3 (three) times daily.    [provider]  diclofenac sodium (VOLTAREN) 1 % GEL Apply 2 g topically 4 (four) times daily. 11/17/18   Tasia Catchings, Divonte Senger V, PA-C  Multiple Vitamin (MULTIVITAMIN) tablet Take 1 tablet by mouth daily.    [provider]  predniSONE (DELTASONE) 50 MG tablet Take 1 tablet (50 mg total) by mouth daily with breakfast. 11/17/18   Ok Edwards, PA-C    Family History Family History  Problem Relation Age of Onset  . Cancer Other   . Hypertension Other   . Hyperlipidemia Other   . Sleep apnea Other   . Heart attack Other     Social History Social History   Tobacco Use  . Smoking status: Never Smoker  . Smokeless tobacco: Never Used  Substance Use Topics  . Alcohol use: No  . Drug use: No     Allergies   Patient has no known allergies.   Review of Systems Review of Systems  See HPI.    Physical Exam Triage Vital Signs ED Triage Vitals  Enc Vitals Group  BP 11/17/18 1643 (!) 143/87     Pulse Rate 11/17/18 1643 77     Resp 11/17/18 1643 18     Temp 11/17/18 1643 98.6 F (37 C)     Temp Source 11/17/18 1643 Oral     SpO2 11/17/18 1643 98 %     Weight --      Height --      Head Circumference --      Peak Flow --      Pain Score 11/17/18 1644 8     Pain Loc --      Pain Edu? --      Excl. in [place]? --    No data found.  Updated Vital Signs BP (!) 143/87 (BP Location: Left Arm)   Pulse 77   Temp 98.6 F (37 C) (Oral)   Resp 18   SpO2 98%   Physical Exam Constitutional:      General: She is not in acute distress.    Appearance: Normal appearance. She is not ill-appearing or toxic-appearing.  HENT:     Head: Normocephalic and atraumatic.  Cardiovascular:     Rate and Rhythm: Normal rate and regular rhythm.  Pulmonary:      Effort: Pulmonary effort is normal.     Breath sounds: Normal breath sounds.  Musculoskeletal:     Comments: Left knee: No swelling, erythema, contusions. No tenderness to palpation. Full ROM. Strength normal and equal bilaterally. Sensation intact and equal bilaterally  Left ankle: No swelling, erythema, contusions. Mild tenderness to palpation of Achilles tendon. Dorsiflexon of left foot elicits pain. Full ROM. Strength normal and equal bilaterally. Sensation intact and equal bilaterally. Pedal pulse 2+.   Negative homans. No swelling of the calf. No erythema/warmth.   Skin:    General: Skin is warm and dry.     Capillary Refill: Capillary refill takes less than 2 seconds.  Neurological:     Mental Status: She is alert.  Psychiatric:        Mood and Affect: Mood normal.        Behavior: Behavior normal.    UC Treatments / Results  Labs (all labs ordered are listed, but only abnormal results are displayed) Labs Reviewed - No data to display  EKG   Radiology No results found.  Procedures Procedures (including critical care time)  Medications Ordered in UC Medications - No data to display  Initial Impression / Assessment and Plan / UC Course  I have reviewed the triage vital signs and the nursing notes.  Pertinent labs & imaging results that were available during my care of the patient were reviewed by me and considered in my medical decision making (see chart for details).      Discussed pain likely due to inflammation of plantar fascitis and/or achilles tendon. Prednisone ordered based on history of gastric band. Knee pain likely caused by compensating for pain in heel. Knee sleeve fitted and voltaren gel prescription give. Instructed to rest as needed and ice knee once a day. Information given for sports medicine if no improvement. Return precautions given.   Final Clinical Impressions(s) / UC Diagnoses   Final diagnoses:  Acute pain of left knee  Pain of left heel    ED Prescriptions    Medication Sig Dispense Auth. Provider   predniSONE (DELTASONE) 50 MG tablet Take 1 tablet (50 mg total) by mouth daily with breakfast. 5 tablet Teneshia Hedeen V, PA-C   diclofenac sodium (VOLTAREN) 1 % GEL Apply 2  g topically 4 (four) times daily. 100 g Tobin Chad, [place] 11/17/18 1749

## 2019-05-22 ENCOUNTER — Other Ambulatory Visit: Payer: Self-pay | Admitting: Family Medicine

## 2019-05-22 ENCOUNTER — Ambulatory Visit
Admission: RE | Admit: 2019-05-22 | Discharge: 2019-05-22 | Disposition: A | Discharge status: Home / Self Care | Payer: BLUE CROSS/BLUE SHIELD | Source: Ambulatory Visit | Attending: Family Medicine | Admitting: Family Medicine

## 2019-05-22 ENCOUNTER — Other Ambulatory Visit: Payer: Self-pay

## 2019-05-22 DIAGNOSIS — Z01818 Encounter for other preprocedural examination: Secondary | ICD-10-CM

## 2019-06-18 HISTORY — PX: AUGMENTATION MAMMAPLASTY: SUR837

## 2019-09-24 ENCOUNTER — Ambulatory Visit
Admission: EM | Admit: 2019-09-24 | Discharge: 2019-09-24 | Disposition: A | Discharge status: Home / Self Care | Payer: 59 | Attending: Emergency Medicine | Admitting: Emergency Medicine

## 2019-09-24 DIAGNOSIS — R0789 Other chest pain: Secondary | ICD-10-CM | POA: Diagnosis not present

## 2019-09-24 NOTE — Discharge Instructions (Addendum)
Keep track of symptoms (keep a log) and follow up with PCP and cardiology. Go to ER for worsening pain, difficulty breathing, weakness/dizziness, nausea/vomiting, or you pass out.

## 2019-09-24 NOTE — ED Triage Notes (Signed)
Pt c/o chest pain with radiating down lt arm with SOB since Monday while walking. States pain subsides with rest. States has had 1 episode every day.

## 2019-09-24 NOTE — ED Provider Notes (Signed)
[facility]    CSN: [id number] Arrival date & time: 09/24/19  1114      History   Chief Complaint Chief Complaint  Patient presents with  . Chest Pain    HPI Patricia Mcclure is a 49 y.o. female with history of obesity, hypertension, hyperlipidemia, GERD, depression, LAP-BAND surgery presenting for retrosternal chest pain with exertion since Monday.  Patient does endorse mild dyspnea, left-sided radiation without upper extremity numbness or weakness.  Denies lightheadedness, dizziness, nausea, vomiting.  States it subsides with rest for a few minutes.  Does have an albuterol inhaler at home (denies history of asthma), though has not tried this.  Denies lower leg swelling, recent change in diet, medication, lifestyle.  Denies tobacco, illicit drug use, heavy alcohol use.  Denies history of stroke or heart attack.  Reports compliance with Lipitor.  Has never been evaluated by cardiologist, though sees her PCP regularly.  Denies increased GERD symptoms such as reflux, heartburn, cough.  No chest pain currently and denies palpitations.   Past Medical History:  Diagnosis Date  . Depression    HX DEPRESSION IN PAST  . GERD (gastroesophageal reflux disease)   . High cholesterol   . Hypertension   . Obesity     Patient Active Problem List   Diagnosis Date Noted  . LAP-BAND surgery status 07/30/2013  . Morbid obesity ([place]) 09/05/2012    Past Surgical History:  Procedure Laterality Date  . BREAST REDUCTION SURGERY    . FASCIOTOMY FOOT / TOE  [id number]  . INSERTION OF MESH  11/04/2012   Procedure: INSERTION OF MESH;  Surgeon: Edward Jolly, MD;  Location: [facility];  Service: General;;  . LAPAROSCOPIC GASTRIC BANDING N/A 11/04/2012   Procedure: LAPAROSCOPIC GASTRIC BANDING;  Surgeon: Edward Jolly, MD;  Location: [facility];  Service: General;  Laterality: N/A;  . NOVASURE ABLATION      OB History   No obstetric history on file.      Home Medications    Prior to  Admission medications   Medication Sig Start Date End Date Taking? Authorizing Provider  atorvastatin (LIPITOR) 10 MG tablet Take 10 mg by mouth daily.    [provider]  Calcium Citrate-Vitamin D (CALCIUM CITRATE + PO) Take 1,200 mg by mouth 3 (three) times daily.    [provider]  lisinopril (ZESTRIL) 10 MG tablet Take 10 mg by mouth daily.    [provider]  Multiple Vitamin (MULTIVITAMIN) tablet Take 1 tablet by mouth daily.    [provider]    Family History Family History  Problem Relation Age of Onset  . Cancer Other   . Hypertension Other   . Hyperlipidemia Other   . Sleep apnea Other   . Heart attack Other     Social History Social History   Tobacco Use  . Smoking status: Never Smoker  . Smokeless tobacco: Never Used  Substance Use Topics  . Alcohol use: No  . Drug use: No     Allergies   Patient has no known allergies.   Review of Systems As per HPI   Physical Exam Triage Vital Signs ED Triage Vitals  Enc Vitals Group     BP      Pulse      Resp      Temp      Temp src      SpO2      Weight      Height  Head Circumference      Peak Flow      Pain Score      Pain Loc      Pain Edu?      Excl. in [place]?    No data found.  Updated Vital Signs BP 123/80 (BP Location: Left Arm)   Pulse 75   Temp 98 F (36.7 C) (Oral)   Resp 18   SpO2 99%   Visual Acuity Right Eye Distance:   Left Eye Distance:   Bilateral Distance:    Right Eye Near:   Left Eye Near:    Bilateral Near:     Physical Exam Vitals reviewed.  Constitutional:      General: She is not in acute distress.    Appearance: She is normal weight. She is not ill-appearing.  HENT:     Head: Normocephalic and atraumatic.  Eyes:     General: No scleral icterus.       Right eye: No discharge.        Left eye: No discharge.     Extraocular Movements: Extraocular movements intact.     Pupils: Pupils are equal, round, and reactive to  light.  Cardiovascular:     Rate and Rhythm: Normal rate and regular rhythm.     Pulses: Normal pulses.     Heart sounds: No murmur heard.   Pulmonary:     Effort: Pulmonary effort is normal. No respiratory distress.     Breath sounds: No wheezing.  Chest:     Chest wall: No tenderness.  Abdominal:     General: Abdomen is flat.     Palpations: Abdomen is soft.     Tenderness: There is no abdominal tenderness. There is no guarding.  Musculoskeletal:     Cervical back: Normal range of motion and neck supple. No muscular tenderness.  Lymphadenopathy:     Cervical: No cervical adenopathy.  Skin:    General: Skin is warm.     Capillary Refill: Capillary refill takes less than 2 seconds.     Coloration: Skin is not jaundiced or pale.     Findings: No rash.  Neurological:     General: No focal deficit present.     Mental Status: She is alert and oriented to person, place, and time.  Psychiatric:        Mood and Affect: Mood normal.        Thought Content: Thought content normal.      UC Treatments / Results  Labs (all labs ordered are listed, but only abnormal results are displayed) Labs Reviewed - No data to display  EKG   Radiology No results found.  Procedures Procedures (including critical care time)  Medications Ordered in UC Medications - No data to display  Initial Impression / Assessment and Plan / UC Course  I have reviewed the triage vital signs and the nursing notes.  Pertinent labs & imaging results that were available during my care of the patient were reviewed by me and considered in my medical decision making (see chart for details).     Patient afebrile, nontoxic, and hemodynamically stable in office.  No respiratory distress, and cardiopulmonary exam is reassuring at this time.  EKG done office, reviewed by me and compared to previous from July 2014: NSR with ventricular rate 73 bpm.  No QTC prolongation, ST elevation or depression.  Waveforms  stable in all leads: Nonacute EKG. reviewed [facility] patient verbalized understanding.  Discussed possibility of stable angina vs  bronchospasm vs reflux opponent.  Will have patient follow-up with PCP, keep symptom log in the interim, and provided contact information for cardiology.  ER return precautions discussed, patient verbalized understanding and is agreeable to plan. Final Clinical Impressions(s) / UC Diagnoses   Final diagnoses:  Other chest pain     Discharge Instructions     Keep track of symptoms (keep a log) and follow up with PCP and cardiology. Go to ER for worsening pain, difficulty breathing, weakness/dizziness, nausea/vomiting, or you pass out.    ED Prescriptions    None     PDMP not reviewed this encounter.   Hall[place], [place], [place] 09/24/19 1243

## 2019-10-20 ENCOUNTER — Other Ambulatory Visit: Payer: Self-pay | Admitting: Family Medicine

## 2019-10-20 DIAGNOSIS — Z1231 Encounter for screening mammogram for malignant neoplasm of breast: Secondary | ICD-10-CM

## 2019-10-23 ENCOUNTER — Ambulatory Visit
Admission: RE | Admit: 2019-10-23 | Discharge: 2019-10-23 | Disposition: A | Discharge status: Home / Self Care | Payer: 59 | Source: Ambulatory Visit | Attending: Family Medicine | Admitting: Family Medicine

## 2019-10-23 ENCOUNTER — Other Ambulatory Visit: Payer: Self-pay

## 2019-10-23 DIAGNOSIS — Z1231 Encounter for screening mammogram for malignant neoplasm of breast: Secondary | ICD-10-CM

## 2019-10-27 ENCOUNTER — Other Ambulatory Visit: Payer: Self-pay | Admitting: Family Medicine

## 2019-10-27 DIAGNOSIS — R928 Other abnormal and inconclusive findings on diagnostic imaging of breast: Secondary | ICD-10-CM

## 2019-11-09 ENCOUNTER — Other Ambulatory Visit: Payer: Self-pay

## 2019-11-09 ENCOUNTER — Ambulatory Visit: Payer: 59

## 2019-11-09 ENCOUNTER — Ambulatory Visit
Admission: RE | Admit: 2019-11-09 | Discharge: 2019-11-09 | Disposition: A | Discharge status: Home / Self Care | Payer: 59 | Source: Ambulatory Visit | Attending: Family Medicine | Admitting: Family Medicine

## 2019-11-09 DIAGNOSIS — R928 Other abnormal and inconclusive findings on diagnostic imaging of breast: Secondary | ICD-10-CM

## 2020-08-23 ENCOUNTER — Other Ambulatory Visit: Payer: Self-pay | Admitting: Surgery

## 2020-08-23 DIAGNOSIS — Z4651 Encounter for fitting and adjustment of gastric lap band: Secondary | ICD-10-CM

## 2020-09-05 ENCOUNTER — Other Ambulatory Visit: Payer: Self-pay | Admitting: Surgery

## 2020-09-05 ENCOUNTER — Ambulatory Visit
Admission: RE | Admit: 2020-09-05 | Discharge: 2020-09-05 | Disposition: A | Discharge status: Home / Self Care | Payer: 59 | Source: Ambulatory Visit | Attending: Surgery | Admitting: Surgery

## 2020-09-05 DIAGNOSIS — Z4651 Encounter for fitting and adjustment of gastric lap band: Secondary | ICD-10-CM

## 2020-11-17 ENCOUNTER — Other Ambulatory Visit: Payer: Self-pay | Admitting: Family Medicine

## 2020-11-17 DIAGNOSIS — Z1231 Encounter for screening mammogram for malignant neoplasm of breast: Secondary | ICD-10-CM

## 2020-12-28 ENCOUNTER — Other Ambulatory Visit: Payer: Self-pay

## 2020-12-28 ENCOUNTER — Ambulatory Visit
Admission: RE | Admit: 2020-12-28 | Discharge: 2020-12-28 | Disposition: A | Discharge status: Home / Self Care | Payer: 59 | Source: Ambulatory Visit | Attending: Family Medicine | Admitting: Family Medicine

## 2020-12-28 DIAGNOSIS — Z1231 Encounter for screening mammogram for malignant neoplasm of breast: Secondary | ICD-10-CM

## 2022-01-29 ENCOUNTER — Other Ambulatory Visit: Payer: Self-pay | Admitting: Family Medicine

## 2022-01-29 DIAGNOSIS — Z1231 Encounter for screening mammogram for malignant neoplasm of breast: Secondary | ICD-10-CM

## 2022-02-02 ENCOUNTER — Ambulatory Visit
Admission: RE | Admit: 2022-02-02 | Discharge: 2022-02-02 | Disposition: A | Discharge status: Home / Self Care | Payer: 59 | Source: Ambulatory Visit | Attending: Family Medicine | Admitting: Family Medicine

## 2022-02-02 DIAGNOSIS — Z1231 Encounter for screening mammogram for malignant neoplasm of breast: Secondary | ICD-10-CM

## 2022-12-19 ENCOUNTER — Institutional Professional Consult (permissible substitution): Payer: BLUE CROSS/BLUE SHIELD | Admitting: Plastic Surgery

## 2022-12-20 ENCOUNTER — Encounter: Payer: Self-pay | Admitting: Family Medicine

## 2022-12-20 ENCOUNTER — Other Ambulatory Visit: Payer: Self-pay | Admitting: Obstetrics and Gynecology

## 2022-12-20 DIAGNOSIS — Z1231 Encounter for screening mammogram for malignant neoplasm of breast: Secondary | ICD-10-CM

## 2023-02-04 ENCOUNTER — Ambulatory Visit: Payer: BLUE CROSS/BLUE SHIELD
